# Patient Record
Sex: Male | Born: 1970 | Race: White | Hispanic: No | Smoking: Never smoker
Health system: Southern US, Community
[De-identification: ages and names within clinical notes are randomized; demographics above are authoritative.]

## PROBLEM LIST (undated history)

## (undated) DIAGNOSIS — E0590 Thyrotoxicosis, unspecified without thyrotoxic crisis or storm: Secondary | ICD-10-CM

## (undated) DIAGNOSIS — F329 Major depressive disorder, single episode, unspecified: Secondary | ICD-10-CM

## (undated) DIAGNOSIS — F32A Depression, unspecified: Secondary | ICD-10-CM

## (undated) DIAGNOSIS — F419 Anxiety disorder, unspecified: Secondary | ICD-10-CM

## (undated) HISTORY — DX: Major depressive disorder, single episode, unspecified: F32.9

## (undated) HISTORY — PX: WISDOM TOOTH EXTRACTION: SHX21

## (undated) HISTORY — PX: NO PAST SURGERIES: SHX2092

## (undated) HISTORY — DX: Depression, unspecified: F32.A

## (undated) HISTORY — DX: Anxiety disorder, unspecified: F41.9

---

## 2001-09-08 ENCOUNTER — Encounter: Payer: Self-pay | Admitting: *Deleted

## 2001-09-08 ENCOUNTER — Encounter: Admission: RE | Admit: 2001-09-08 | Discharge: 2001-09-08 | Payer: Self-pay | Admitting: *Deleted

## 2007-10-13 ENCOUNTER — Encounter: Admission: RE | Admit: 2007-10-13 | Discharge: 2007-10-13 | Payer: Self-pay | Admitting: Family Medicine

## 2008-07-07 IMAGING — CR DG FINGER RING 2+V*L*
1 series · 1 of 1 positions shown · non-contrast
Comparison: none

CLINICAL DATA: Pain and swelling distal fourth finger. 
 LEFT FOURTH FINGER - 3 VIEW:

[view not recorded]
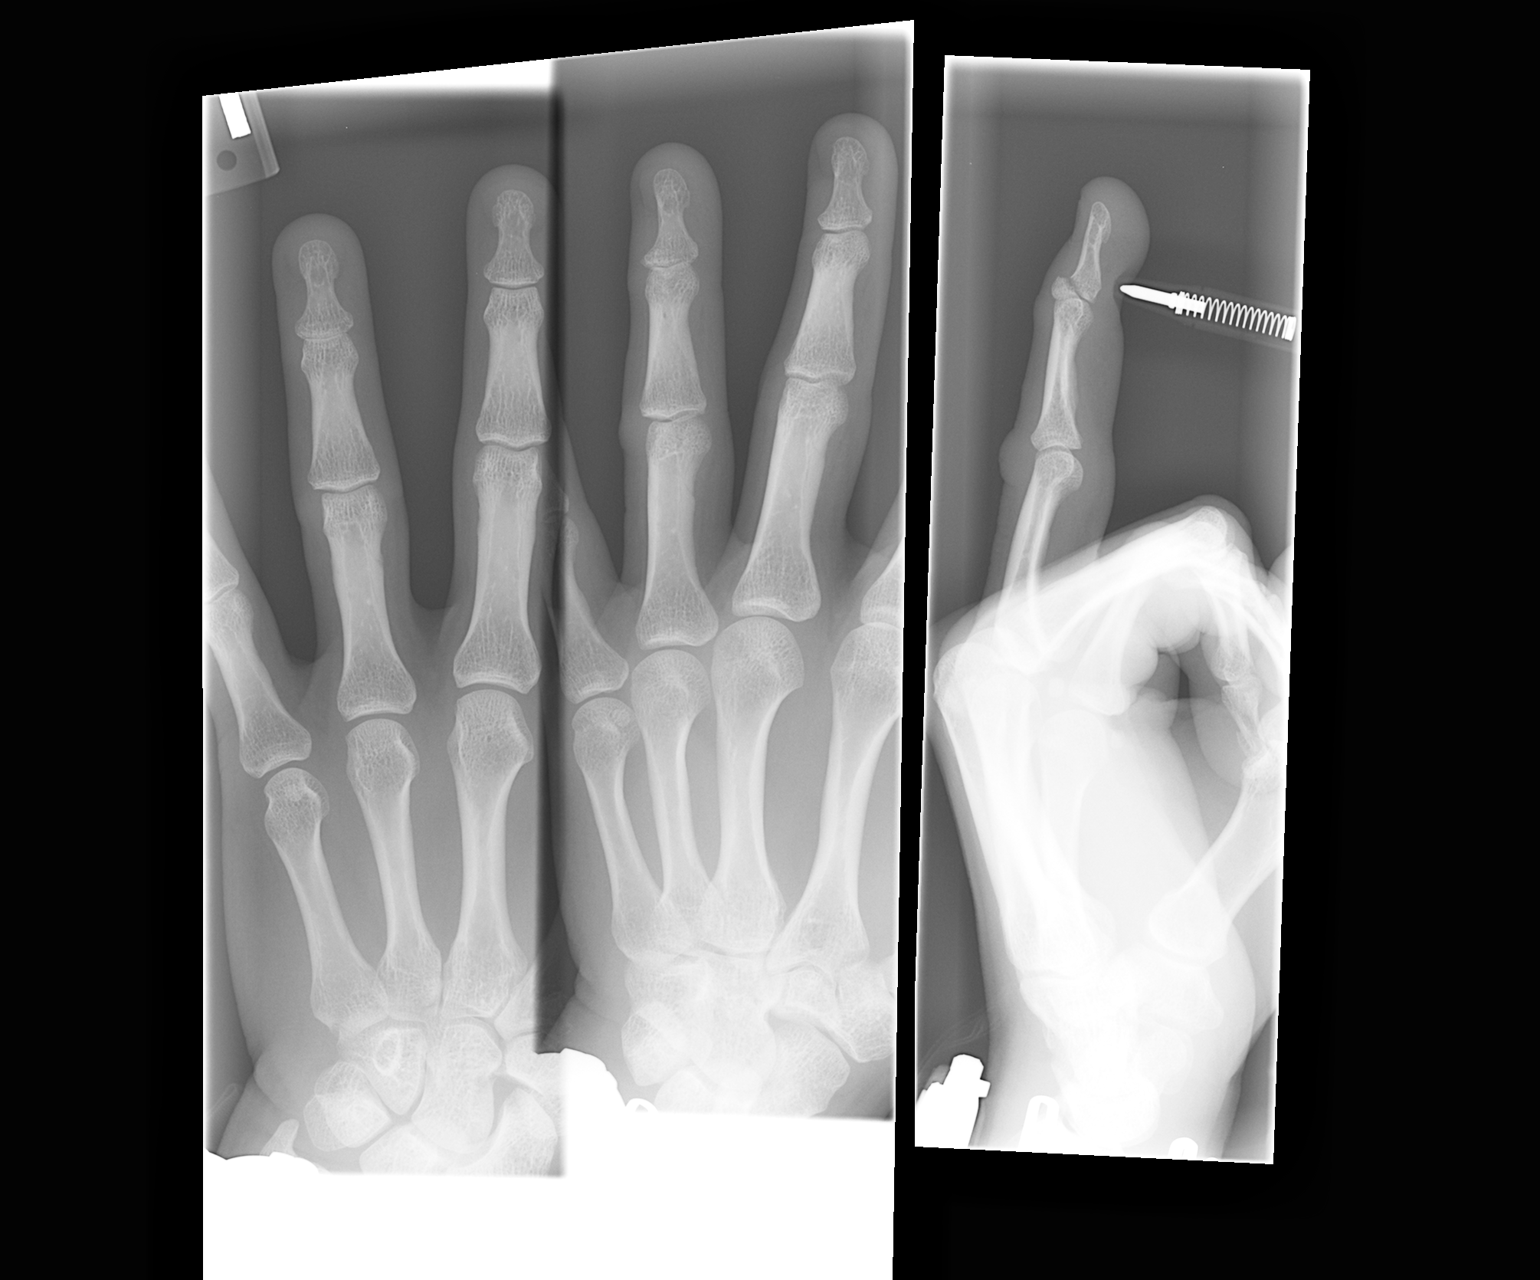

[1 of 1 positions shown; findings below may reference images not displayed]

FINDINGS: Three views of the left fourth finger were obtained.  There is an oblique slightly displaced fracture through the base of the distal phalanx which extends intra-articular into the fourth DIP joint.  No other abnormality is seen.
IMPRESSION: Oblique slightly displaced fracture of the base of the distal phalanx of the left fourth digit extending into the left fourth DIP joint.

## 2013-09-28 ENCOUNTER — Other Ambulatory Visit (HOSPITAL_COMMUNITY): Payer: 59 | Attending: Psychiatry | Admitting: Psychiatry

## 2013-09-28 ENCOUNTER — Encounter (HOSPITAL_COMMUNITY): Payer: Self-pay

## 2013-09-28 DIAGNOSIS — F429 Obsessive-compulsive disorder, unspecified: Secondary | ICD-10-CM

## 2013-09-28 DIAGNOSIS — F332 Major depressive disorder, recurrent severe without psychotic features: Secondary | ICD-10-CM | POA: Insufficient documentation

## 2013-09-28 DIAGNOSIS — F32A Depression, unspecified: Secondary | ICD-10-CM

## 2013-09-28 DIAGNOSIS — F39 Unspecified mood [affective] disorder: Secondary | ICD-10-CM

## 2013-09-28 DIAGNOSIS — F411 Generalized anxiety disorder: Secondary | ICD-10-CM | POA: Insufficient documentation

## 2013-09-28 DIAGNOSIS — F329 Major depressive disorder, single episode, unspecified: Secondary | ICD-10-CM

## 2013-09-28 NOTE — Progress Notes (Signed)
Patient ID: Marvin Hunt, male   DOB: 1971-07-30, 42 y.o.   MRN: [id number] D:  This is a 42 yr old married, Caucasian, male, who was referred per [facility] (Candace Ashley, [facility]), treatment for worsening depressive and anxiety symptoms with SI.  Denies intent, but admits to a plan (shoot self).  Pt states that his wife removed all guns from the home and gave them to her brother.  Discussed safety options with patient.  Pt is able to contract for safety.  Denies HI or A/V hallucinations.  No prior suicide attempts nor gestures.  No prior psychiatric hospitalizations.  States he saw a therapist a few times at age 54 due to stealing.  Reports feeling worse for ~ 1 1/2 weeks.  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  According to pt, he has been stealing office toner for seven years.  Pt states he has been selling it to an outside vendor (on the Internet).  He confessed to his supervisor ~ one week ago.  The HR Director encouraged pt to go to [facility] for help.  Pt is currently suspended.  "I don't know if it's with or without pay."  According to pt, he doesn't know what all he spent the money he earned on.  "At the time, I thought it was a win-win.  I would feel a high while getting away with it."  Pt states the stealing all started whenever his supervisor yrs ago requested that pt dispose of all the unused toner.  "While I was throwing it away, I had an idea that someone probably would buy it from me.  So the first time I got money from selling it, I just kept doing it."  2)  Marriage of 13 yrs.  States they are growing apart.  Pt admits to watching pornography online.  Denies chatting with anyone.  Most recently watched it two weeks ago. Childhood:  When pt was in the seventh grade, his father left the family to be with a male he was having an affair with.  Parents divorced.  While patient's mother worked, pt and sibling were raised by their  grandparents.  Paternal Grandmother (deceased) was alcoholic.  States he didn't like school.  Denies any abuse. Sibling:  One younger sister Kids:  75 yo son and 48 yo son Pt denies any drugs/ETOH.  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  Pt states he most recently shoplifted two weeks ago.  He stole video game items for his sons. Pt doesn't know if there are legal charges pending against him or not. Pt completed all forms.  Scored 25 on the burns.  Pt will attend MH-IOP for ten days.  A:  Oriented pt.  Provided pt with an orientation folder.  Informed Wenda Overland, LPC ([facility]) of admit.  Will refer pt to a psychiatrist.  Encouraged support groups.  R:  Pt receptive.

## 2013-09-29 ENCOUNTER — Encounter (HOSPITAL_COMMUNITY): Payer: 59 | Attending: Psychiatry | Admitting: Psychiatry

## 2013-09-29 DIAGNOSIS — F329 Major depressive disorder, single episode, unspecified: Secondary | ICD-10-CM

## 2013-09-29 DIAGNOSIS — F411 Generalized anxiety disorder: Secondary | ICD-10-CM | POA: Insufficient documentation

## 2013-09-29 DIAGNOSIS — F429 Obsessive-compulsive disorder, unspecified: Secondary | ICD-10-CM | POA: Insufficient documentation

## 2013-09-29 DIAGNOSIS — F32A Depression, unspecified: Secondary | ICD-10-CM

## 2013-09-29 DIAGNOSIS — F332 Major depressive disorder, recurrent severe without psychotic features: Secondary | ICD-10-CM | POA: Insufficient documentation

## 2013-09-29 MED ORDER — MIRTAZAPINE 15 MG PO TABS: 15.0000 mg | ORAL_TABLET | Freq: Every day | ORAL | Status: DC

## 2013-09-29 NOTE — Progress Notes (Signed)
Psychiatric Assessment Adult  Patient Identification:  Marvin Hunt Date of Evaluation:  09/29/2013 Chief Complaint:  Depression and anxiety History of Chief Complaint:   This is a 42 yr old married, Caucasian, male, who was referred per [facility] (Candace Milstead, [facility]), treatment for worsening depressive and anxiety symptoms with SI. Denies intent, but admits to a plan (shoot self). Pt states that his wife removed all guns from the home and gave them to her brother. Discussed safety options with patient. Pt is able to contract for safety. Denies HI or A/V hallucinations. No prior suicide attempts nor gestures. No prior psychiatric hospitalizations. States he saw a therapist a few times at age 73 due to stealing. Reports feeling worse for ~ 1 1/2 weeks. 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 According to pt, he has been stealing office toner for seven years. Pt states he has been selling it to an outside vendor (on the Internet). He confessed to his supervisor ~ one week ago. The HR Director encouraged pt to go to [facility] for help. Pt is currently suspended. "I don't know if it's with or without pay." Patient is unsure if he'll be fired, there's also a strong possibility that the company may be pressing charges. Patient is very worried about this.  According to pt, he doesn't know what all he spent the money he earned on. "At the time, I thought it was a win-win. I would feel a high while getting away with it." Pt states the stealing all started whenever his supervisor yrs ago requested that pt dispose of all the unused toner. "While I was throwing it away, I had an idea that someone probably would buy it from me. So the first time I got money from selling it, I just kept doing it." 2) Marriage of 13 yrs. States they are growing apart. Pt admits to watching pornography online. Denies chatting with anyone. Most recently watched it two  weeks ago. Patient has a history of masturbating while watching porn. Patient states that he had great difficulty after his first child was born he experienced an adjustment disorder and problems with a torn are also began around that time. States that things really went downhill after the workup is second child. Childhood: When pt was in the seventh grade, his father left the family to be with a male he was having an affair with. Parents divorced. While patient's mother worked, pt and sibling were raised by their grandparents. Paternal Grandmother (deceased) was alcoholic. States he didn't like school. Denies any abuse.  Sibling: One younger sister  Kids: 53 yo son and 58 yo son  Pt denies any drugs/ETOH. 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 Pt states he most recently shoplifted two weeks ago. He stole video game items for his sons.  Pt doesn't know if there are legal charges pending against him or not.  Pt completed all forms. Scored 25 on the burns. Pt will attend MH-IOP for ten days. A: Oriented pt. Provided pt with an orientation folder. Informed Wenda Overland, LPC ([facility]) of admit. Will refer pt to a psychiatrist. Encouraged support groups. R: Pt receptive.         HPI Review of Systems Physical Exam  Depressive Symptoms: depressed mood, anhedonia, insomnia, psychomotor agitation, psychomotor retardation, fatigue, feelings of worthlessness/guilt, difficulty concentrating, hopelessness, recurrent thoughts of death, anxiety, loss of energy/fatigue, decreased appetite,  (Hypo) Manic Symptoms:  None   Anxiety Symptoms: Excessive Worry:  Yes Panic Symptoms:  No Agoraphobia:  No Obsessive Compulsive: Yes  Symptoms: Pornography Specific Phobias:  No Social Anxiety:  Yes  Psychotic Symptoms:  Hallucinations: No None Delusions:  No Paranoia:  No   Ideas of Reference:  No  PTSD Symptoms: Ever had a traumatic exposure:   No Had a traumatic exposure in the last month:  No Re-experiencing: No None Hypervigilance:  No Hyperarousal: No None Avoidance: No None  Traumatic Brain Injury: No   Past Psychiatric History: None Diagnosis:   Hospitalizations:   Outpatient Care:   Substance Abuse Care:   Self-Mutilation:   Suicidal Attempts:   Violent Behaviors:    Past Medical History:   Past Medical History  Diagnosis Date  . Anxiety   . Depression    History of Loss of Consciousness:  No Seizure History:  No Cardiac History:  No Allergies:  Not on File Current Medications:  Current Outpatient Prescriptions  Medication Sig Dispense Refill  . mirtazapine (REMERON) 15 MG tablet Take 1 tablet (15 mg total) by mouth at bedtime.  30 tablet  0   No current facility-administered medications for this visit.    Previous Psychotropic Medications: None Medication Dose                         Substance Abuse History in the last 12 months: None Substance Age of 1st Use Last Use Amount Specific Type  Nicotine      Alcohol      Cannabis      Opiates      Cocaine      Methamphetamines      LSD      Ecstasy      Benzodiazepines      Caffeine      Inhalants      Others:                          Medical Consequences of Substance Abuse:   Legal Consequences of Substance Abuse:   Family Consequences of Substance Abuse:   Blackouts:  No DT's:  No Withdrawal Symptoms:  No None  Social History: Current Place of Residence[place] of Birth:  Family Members:  Marital Status:  Married Children: 2  Sons:   Daughters:  Relationships:  Education:  Corporate treasurer Problems/Performance:  Religious Beliefs/Practices:  History of Abuse: none Teacher, music History:  None. Legal History: None Hobbies/Interests:   Family History:   Family History  Problem Relation Age of Onset  . Alcohol abuse Paternal Grandmother     Mental Status  Examination/Evaluation: Objective:  Appearance: Casual and Disheveled  Eye Contact::  Minimal  Speech:  Normal Rate and Slow  Volume:  Decreased  Mood:  Depressed and anxious   Affect:  Constricted, Depressed and Flat  Thought Process:  Goal Directed and Linear  Orientation:  Full (Time, Place, and Person)  Thought Content:  Obsessions and Rumination  Suicidal Thoughts:  No  Homicidal Thoughts:  No  Judgement:  Fair  Insight:  Fair  Psychomotor Activity:  Normal and Decreased  Akathisia:  No  Handed:  Right  AIMS (if indicated):  0  Assets:  Communication Skills Desire for Improvement Resilience Social Support Transportation    Laboratory/X-Ray Psychological Evaluation(s)       Assessment:  Axis I: Major Depression, Recurrent severe and Obsessive Compulsive Disorder  AXIS I Anxiety Disorder  NOS, Major Depression, Recurrent severe and Obsessive Compulsive Disorder  AXIS II Cluster B Traits  AXIS III Past Medical History  Diagnosis Date  . Anxiety   . Depression      AXIS IV occupational problems, other psychosocial or environmental problems, problems related to social environment and problems with primary support group  AXIS V 51-60 moderate symptoms   Treatment Plan/Recommendations:  Plan of Care: Start IOP, monitor mood safety and suicidal ideation.   Laboratory:  Obtain TSH and T4, CBC and metabolic  Psychotherapy: Group, individual therapy and medication management   Medications: I discussed the rationale risks benefits options of Remeron with the patient for his depression and anxiety and patient has given me his informed consent. He'll start Remeron 7.5 mg by mouth each bedtime for 2 days and then increase it to 15 mg by mouth each bedtime.   Routine PRN Medications:  Yes  Consultations: None   Safety Concerns:  None   Other:      Margit Banda, MD 10/15/201410:35 AM

## 2013-09-29 NOTE — Progress Notes (Signed)
    Daily Group Progress Note  Program: IOP  Group Time: 9:00-10:30 am   Participation Level: Active  Behavioral Response: Appropriate  Type of Therapy:  Process Group  Summary of Progress: Pt learned about how to use heartmath to reduce depression and anxiety symptoms.       Group Time: 10:30 am - 12:00 pm   Participation Level:  Active  Behavioral Response: Appropriate  Type of Therapy: Psycho-education Group  Summary of Progress: Pt learned about how to use heartmath to reduce depression and anxiety symptoms.    Carman Ching, LCSW

## 2013-09-30 ENCOUNTER — Telehealth (HOSPITAL_COMMUNITY): Payer: Self-pay | Admitting: Psychiatry

## 2013-09-30 ENCOUNTER — Encounter (HOSPITAL_COMMUNITY): Payer: 59

## 2013-09-30 NOTE — Progress Notes (Signed)
Patient ID: Marvin Hunt, male   DOB: [birth year], 42 y.o.   MRN: [id number] D:  Placed call to Dr. Rutherford Limerick to inform her about patient feeling very tired and groggy this morning.  A:  Dr. Rutherford Limerick would like pt to take the Remeron 30 min to one hour earlier and to drink a cup of strong coffee in the a.m.  Informed pt.  R:  Pt receptive.

## 2013-09-30 NOTE — Progress Notes (Signed)
    Daily Group Progress Note  Program: IOP  Group Time: 9:00-10:30 am   Participation Level: Active  Behavioral Response: Appropriate  Type of Therapy:  Process Group  Summary of Progress: Pt was tearful as he talked about "fear for his future" and described how for the past seven years he has been stealing supplies from his employer and selling them. Pt expressed concern for his wife and children and his future. Pt said he got a "thrill" out of stealing and selling and does not know why he did it. Pt said he feels "worthless" and "dissapointed" in himself.      Group Time: 10:30 am - 12:00 pm   Participation Level:  Active  Behavioral Response: Appropriate  Type of Therapy: Psycho-education Group  Summary of Progress: Pt learned the stress management skill of heartmath to reduce feelings of depression and anxiety.  Carman Ching, LCSW

## 2013-10-01 ENCOUNTER — Encounter (HOSPITAL_COMMUNITY): Payer: 59

## 2013-10-04 ENCOUNTER — Encounter (HOSPITAL_COMMUNITY): Payer: 59 | Admitting: Psychiatry

## 2013-10-04 DIAGNOSIS — F329 Major depressive disorder, single episode, unspecified: Secondary | ICD-10-CM

## 2013-10-04 DIAGNOSIS — F32A Depression, unspecified: Secondary | ICD-10-CM

## 2013-10-05 ENCOUNTER — Encounter (HOSPITAL_COMMUNITY): Payer: 59 | Admitting: Psychiatry

## 2013-10-05 DIAGNOSIS — F329 Major depressive disorder, single episode, unspecified: Secondary | ICD-10-CM

## 2013-10-05 DIAGNOSIS — F32A Depression, unspecified: Secondary | ICD-10-CM

## 2013-10-05 NOTE — Progress Notes (Signed)
    Daily Group Progress Note  Program: IOP  Group Time: 9:00-10:30 am   Participation Level: Active  Behavioral Response: Appropriate  Type of Therapy:  Process Group  Summary of Progress: Pt reports improved mood and a sense of hope and peace about his current place. 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      Group Time: 10:30 am - 12:00 pm   Participation Level:  Active  Behavioral Response: Appropriate  Type of Therapy: Psycho-education Group  Summary of Progress: Pt participated in a group on grief and loss and identified current losses impacting wellness and appropriate grieving strategies.   Carman Ching, LCSW

## 2013-10-06 ENCOUNTER — Encounter (HOSPITAL_COMMUNITY): Payer: 59 | Admitting: Psychiatry

## 2013-10-06 DIAGNOSIS — F32A Depression, unspecified: Secondary | ICD-10-CM

## 2013-10-06 DIAGNOSIS — F329 Major depressive disorder, single episode, unspecified: Secondary | ICD-10-CM

## 2013-10-07 ENCOUNTER — Encounter (HOSPITAL_COMMUNITY): Payer: 59 | Admitting: Psychiatry

## 2013-10-07 DIAGNOSIS — F32A Depression, unspecified: Secondary | ICD-10-CM

## 2013-10-07 DIAGNOSIS — F329 Major depressive disorder, single episode, unspecified: Secondary | ICD-10-CM

## 2013-10-07 NOTE — Progress Notes (Signed)
    Daily Group Progress Note  Program: IOP  Group Time: 9:00 am -12:00 pm  Participation Level: Active  Behavioral Response: Appropriate  Type of Therapy:  Process Group  Summary of Progress: member participated in two goodbye ceremonies for members ending the group and had the opportunity for healthy closure. Pt identified healthy forms of grieving losses.      Cathie Bonnell E, LCSW 

## 2013-10-07 NOTE — Progress Notes (Signed)
    Daily Group Progress Note  Program: IOP  Group Time: 9:00-10:30 am   Participation Level: Active  Behavioral Response: Appropriate  Type of Therapy:  Process Group  Summary of Progress: Pt is anxious as he awaits the decision from his employer on if they will pursue legal action against him. Pt said he may try to contact them today to get further information on their decision to try to reduce his stress level. Pt is making peace with his actions and working towards understanding his behavior and on healing his marriage and family from this incident.      Group Time: 10:30 am - 12:00 pm   Participation Level:  Active  Behavioral Response: Appropriate  Type of Therapy: Psycho-education Group  Summary of Progress: Pt learned about low self-esteem and how it begins and effects mood and behaviors today.   Carman Ching, LCSW

## 2013-10-07 NOTE — Progress Notes (Signed)
    Daily Group Progress Note  Program: IOP  Group Time: 9:00 am -12:00 pm  Participation Level: Active  Behavioral Response: Appropriate  Type of Therapy:  Process Group  Summary of Progress: Pt learned about the symptoms of depression and how the symptoms are present in their daily living and spoke about each symptom they are experiencing.        Arran Fessel E, LCSW 

## 2013-10-08 ENCOUNTER — Encounter (HOSPITAL_COMMUNITY): Payer: 59 | Admitting: Psychiatry

## 2013-10-08 DIAGNOSIS — F32A Depression, unspecified: Secondary | ICD-10-CM

## 2013-10-08 DIAGNOSIS — F329 Major depressive disorder, single episode, unspecified: Secondary | ICD-10-CM

## 2013-10-11 ENCOUNTER — Encounter (HOSPITAL_COMMUNITY): Payer: 59 | Admitting: Psychiatry

## 2013-10-11 DIAGNOSIS — F32A Depression, unspecified: Secondary | ICD-10-CM

## 2013-10-11 DIAGNOSIS — F329 Major depressive disorder, single episode, unspecified: Secondary | ICD-10-CM

## 2013-10-11 NOTE — Progress Notes (Signed)
    Daily Group Progress Note  Program: IOP  Group Time: 9:00-10:30 am   Participation Level: Active  Behavioral Response: Appropriate  Type of Therapy:  Process Group  Summary of Progress: Pt was quiet today and only spoke when called upon to share. Pt said he is feeling grateful for his family and trying to redirect his thoughts away from worries about what his employer will decide regarding pressing charges. Pt said he is trying to focus on enjoying time with his family as he awaits their decision Pt is receiving support from the group and reflecting on his past behaviors that led him to this current situation.      Group Time: 10:30 am - 12:00 pm   Participation Level:  Active  Behavioral Response: Appropriate  Type of Therapy: Psycho-education Group  Summary of Progress: Pt participated in a group with a focus on grief and loss and identified losses impacting overall wellness and effective grieving strategies.   Carman Ching, LCSW

## 2013-10-11 NOTE — Progress Notes (Signed)
    Daily Group Progress Note  Program: IOP  Group Time: 9:00-10:30 am   Participation Level: Active  Behavioral Response: Appropriate  Type of Therapy:  Process Group  Summary of Progress: Pt reports stable mood and optimism over his current situation but processed feeling "stuck" in not having closure and resolution to the situation that causes some anxiety.      Group Time: 10:30 am - 12:00 pm   Participation Level:  None  Behavioral Response: none  Type of Therapy: Psycho-education Group  Summary of Progress: Group ended an hour early due to Clinical research associate having a family emergency.   Carman Ching, LCSW

## 2013-10-12 ENCOUNTER — Encounter (HOSPITAL_COMMUNITY): Payer: 59 | Admitting: Psychiatry

## 2013-10-12 DIAGNOSIS — F329 Major depressive disorder, single episode, unspecified: Secondary | ICD-10-CM

## 2013-10-12 DIAGNOSIS — F32A Depression, unspecified: Secondary | ICD-10-CM

## 2013-10-13 ENCOUNTER — Encounter (HOSPITAL_COMMUNITY): Payer: 59 | Admitting: Psychiatry

## 2013-10-13 DIAGNOSIS — F329 Major depressive disorder, single episode, unspecified: Secondary | ICD-10-CM

## 2013-10-13 DIAGNOSIS — F32A Depression, unspecified: Secondary | ICD-10-CM

## 2013-10-13 NOTE — Progress Notes (Signed)
    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 Pt had expressed concerns about finances due to him not brining in any income and he said he feels hopeful now for his family and his future if he can contribute financially.      Group Time: 10:30 am - 12:00 pm   Participation Level:  Active  Behavioral Response: Appropriate  Type of Therapy: Psycho-education Group  Summary of Progress: pt learned the skill of mindfulness and how to apply it to every day life to reduce stress and depression and be "in the moment".  Carman Ching, LCSW

## 2013-10-13 NOTE — Progress Notes (Signed)
    Daily Group Progress Note  Program: IOP  Group Time: 9:00-10:30 am   Participation Level: Active  Behavioral Response: Appropriate  Type of Therapy:  Process Group  Summary of Progress:  Pt participated in discussion what makes group effective and processed barriers to maximizing progress in a group setting. Pt spoke about ways they could "sabotage their wellness" by not committing fully to the process and not being fully honest with what they struggles are.       Group Time: 10:30 am - 12:00 pm   Participation Level:  Active  Behavioral Response: Appropriate  Type of Therapy: Psycho-education Group  Summary of Progress: Pt participated in discussion what makes group effective and processed barriers to maximizing progress in a group setting. Pt spoke about ways they could "sabotage their wellness" by not committing fully to the process and not being fully honest with what they struggles are.    Carman Ching, LCSW

## 2013-10-14 ENCOUNTER — Encounter (HOSPITAL_COMMUNITY): Payer: 59 | Admitting: Psychiatry

## 2013-10-14 DIAGNOSIS — F32A Depression, unspecified: Secondary | ICD-10-CM

## 2013-10-14 DIAGNOSIS — F329 Major depressive disorder, single episode, unspecified: Secondary | ICD-10-CM

## 2013-10-14 NOTE — Progress Notes (Signed)
    Daily Group Progress Note  Program: IOP  Group Time: 9:00-10:30 am   Participation Level: Active  Behavioral Response: Appropriate  Type of Therapy:  Process Group  Summary of Progress: Pt appeared optimistic and hopeful again today due to feeling like the interview he had yesterday went well. He is hoping to start his new job next week and is trying to rebuild his self-confidence and his relationship with his wife following the situation at his previous job.      Group Time: 10:30 am - 12:00 pm   Participation Level:  Active  Behavioral Response: Appropriate  Type of Therapy: Psycho-education Group  Summary of Progress:  Pt participated in a goodbye ceremony for two members ending the group today and practiced having healthy closure and grieving losses.   Carman Ching, LCSW

## 2013-10-15 ENCOUNTER — Encounter (HOSPITAL_COMMUNITY): Payer: 59 | Admitting: Psychiatry

## 2013-10-15 DIAGNOSIS — F329 Major depressive disorder, single episode, unspecified: Secondary | ICD-10-CM

## 2013-10-15 DIAGNOSIS — F32A Depression, unspecified: Secondary | ICD-10-CM

## 2013-10-15 MED ORDER — MIRTAZAPINE 15 MG PO TABS: 15.0000 mg | ORAL_TABLET | Freq: Every day | ORAL | Status: DC

## 2013-10-15 NOTE — Progress Notes (Signed)
Discharge Note  Patient:  Marvin Hunt is an 42 y.o., male DOB:  Jul 09, 1971  Date of Admission:  09/29/13  Date of Discharge:  10/15/13  Reason for Admission: Depression and anxiety  Hospital Course: Patient started IOP and was pretty severely depressed and anxious because of his job situation. He was started on Remeron 15 mg every night and this helped him significantly. He was fired from his job and patient was able to process this in group and started applying for new jobs and did get a couple of interviews.  Patient gradually stabilized with improved sleep and appetite and significant reduction of his anxiety, his mood improved he had no suicidal or homicidal ideation and was coping well and tolerating his medications well.  Mental Status at Discharge: Alert, oriented x3, affect was bright mood was euthymic speech was normal, no suicidal or homicidal ideation, no hallucinations or delusions. Recent and remote memory was good, judgment and insight was good, concentration and recall was good.  Lab Results: No results found for this or any previous visit (from the past 48 hour(s)).  Current outpatient prescriptions:mirtazapine (REMERON) 15 MG tablet, Take 1 tablet (15 mg total) by mouth at bedtime., Disp: 30 tablet, Rfl: 1  Axis Diagnosis:   Axis I: Anxiety Disorder NOS, Major Depression, Recurrent severe and Obsessive Compulsive Disorder Axis II: Cluster C Traits Axis III:  Past Medical History  Diagnosis Date  . Anxiety   . Depression    Axis IV: economic problems, occupational problems, other psychosocial or environmental problems, problems related to social environment and problems with primary support group Axis V: 61-70 mild symptoms   Level of Care:  OP  Discharge destination:  Home  Is patient on multiple antipsychotic therapies at discharge:  No    Has Patient had three or more failed trials of antipsychotic monotherapy by history:  No  Patient phone:   [phone] (home)  Patient address:   [address] [place] [address],   Follow-up recommendations:  Activity:  As tolerated Diet:  Regular Other:  Followup for medications with Dr. Lolly Mustache and Wenda Overland for therapy    The patient received suicide prevention pamphlet:  Yes  Margit Banda 10/15/2013, 12:01 PM

## 2013-10-15 NOTE — Patient Instructions (Signed)
Patient completed MH-IOP today.  Will follow up with Wenda Overland, LPC on 10-21-13 @ 10am and Dr. Lolly Mustache on 11-22-13 @ 11am.  Encouraged support groups.

## 2013-10-15 NOTE — Progress Notes (Signed)
    Daily Group Progress Note  Program: IOP  Group Time: 9:00-10:30 am   Participation Level: Active  Behavioral Response: Appropriate  Type of Therapy:  Process Group  Summary of Progress: Today was Pts final day in the group. Pt said he feels "optimistic" about his future and his new job opportunity. Pt said he was very depressed and hopeless at the start of the group but now feels hopeful. He said he made progress with forgiving himself for his mistakes and learning tools to distract away from his worrisome thought patterns.      Group Time: 10:30 am - 12:00 pm   Participation Level:  Active  Behavioral Response: Appropriate  Type of Therapy: Psycho-education Group  Summary of Progress: Pt participated in a goodbye ceremony and was introduced and practiced the skill of distraction to shift away from worrisome thinking and create positive feelings to replace the worry.   Carman Ching, LCSW

## 2013-10-15 NOTE — Progress Notes (Signed)
Patient ID: Marvin Hunt, male   DOB: [birth year], 42 y.o.   MRN: [id number] D:  This is a 42 yr old married, Caucasian, male, who was referred per [facility] (Candace Rincon, [facility]), treatment for worsening depressive and anxiety symptoms with SI. Denies SI/HI or A/V hallucinations. 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 According to pt, he has been stealing office toner for seven years. Pt states he has been selling it to an outside vendor (on the Internet). He confessed to his supervisor ~ one week ago. The HR Director encouraged pt to go to [facility] for help. Pt is currently suspended. "I don't know if it's with or without pay." According to pt, he doesn't know what all he spent the money he earned on. "At the time, I thought it was a win-win. I would feel a high while getting away with it." Pt states the stealing all started whenever his supervisor yrs ago requested that pt dispose of all the unused toner. "While I was throwing it away, I had an idea that someone probably would buy it from me. So the first time I got money from selling it, I just kept doing it." 2) Marriage of 13 yrs. States they are growing apart. Pt admits to watching pornography online. Denies chatting with anyone.  Pt completed MH-IOP today.  States he is feeling much better.  Decreased depressive and anxiety symptoms.  Had a job interview a couple of days ago.  Pt is waiting to hear back.  A:  D/C today.  F/U with Wenda Overland, LPC on 10-21-13 @ 10 am and Dr. Lolly Mustache 11-22-13 @ 11am.  Encouraged support groups.  R:  Pt receptive.

## 2013-10-18 ENCOUNTER — Encounter (HOSPITAL_COMMUNITY): Payer: 59

## 2013-10-19 ENCOUNTER — Encounter (HOSPITAL_COMMUNITY): Payer: 59

## 2013-11-22 ENCOUNTER — Encounter (HOSPITAL_COMMUNITY): Payer: Self-pay | Admitting: Psychiatry

## 2013-11-22 ENCOUNTER — Ambulatory Visit (INDEPENDENT_AMBULATORY_CARE_PROVIDER_SITE_OTHER): Payer: 59 | Admitting: Psychiatry

## 2013-11-22 VITALS — BP 126/85 | HR 75 | Ht 69.0 in | Wt 166.6 lb

## 2013-11-22 DIAGNOSIS — F329 Major depressive disorder, single episode, unspecified: Secondary | ICD-10-CM

## 2013-11-22 DIAGNOSIS — F429 Obsessive-compulsive disorder, unspecified: Secondary | ICD-10-CM

## 2013-11-22 MED ORDER — MIRTAZAPINE 30 MG PO TABS: 30.0000 mg | ORAL_TABLET | Freq: Every day | ORAL | Status: DC

## 2013-11-22 MED ORDER — HYDROXYZINE PAMOATE 25 MG PO CAPS: ORAL_CAPSULE | ORAL | Status: DC

## 2013-11-22 NOTE — Progress Notes (Addendum)
[facility] Initial Assessment Note  Marvin Hunt [id number] 42 y.o.  11/22/2013 11:48 AM  Chief Complaint:  I'm still very anxious and depressed.  History of Present Illness:  Patient is a 42 year old Caucasian, employed married man who was referred from intensive outpatient program.  Patient attended the intensive outpatient program from October 15 to October 31.  He was referred from [facility] because he was very depressed anxious and having planned to shoot himself.  His wife removed all the guns from the home.  Patient endorsed multiple stressors in his life.  Patient was working at [employer] for [employer] for more than 19 years and recently accused for stealing things. He also endorses having addiction with pornography.  He was very stressed about his job and he was unsure if he will be fired or prosecuted.  He admitted to stealing things and selling them.  He has spend all his money.  Recently he find out that company is going to press charges against him.  He does not know the court date but he knows he will will be a charge for embezzlement.  He was doing better with the Remeron which was started on intensive outpatient program however recently felt increased anxious and depressed.  Patient denies any suicidal thoughts.  He is involved in church.  He had a good supportive family.  He told he will never do anything to make his family embarrassed.  He is trying to get help.  His brother-in-law is accounting.  He has 2 sons .  He recently find a new job in [place].  He likes his job.  He is cooperative with Detective who promise not to disclose to his new job about his legal charges.  Patient admitted sometime feeling nervous anxious and worried about his future .  However he denies any paranoia, hallucination or any aggressive or violent behavior.  He is not drinking ETOH or using any illicit substances.  He is taking Remeron 15 mg without any side effects.  It is  helping his sleep .  He also endorses some time having obsessive thoughts but denies any rituals .  Denies any PTSD symptoms, homicidal thoughts or paranoia.  He endorse sometime excessive feelings of guilt, shame, low self-esteem decreased energy and lack of motivation to do things.  However his appetite is unchanged from the past.  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  Suicidal Ideation: No Plan Formed: No Patient has means to carry out plan: No  Homicidal Ideation: No Plan Formed: No Patient has means to carry out plan: No  Past Psychiatric History/Hospitalization(s) Patient denies any history of suicidal attempt, history of inpatient psychiatric treatment, paranoia, psychosis or any hallucination.  He recently finished intensive outpatient program. Anxiety: Yes Bipolar Disorder: No Depression: Yes Mania: No Psychosis: No Schizophrenia: No Personality Disorder: No Hospitalization for psychiatric illness: No History of Electroconvulsive Shock Therapy: No Prior Suicide Attempts: No  Medical History; Patient has no active medical problems.  He denies any history of head trauma, seizures, headaches, loss of consciousness.  He has no primary care physician.  Traumatic brain injury: Denies  Family History; Denies  Education and Work History; Patient is working as an Consulting civil engineer.  He started a new job in [employer].  He used to work at [employer] for [employer] for 19 years.  Psychosocial History; Patient born and raised in [place].  When he was in seventh grade his father left the  family to be with another male.  His parents are divorced.  He is married for more than 13 years.  He has 2 sons.  Patient has supportive wife.  Legal History; Patient is recently charged for embezzlement.  History Of Abuse; Denies  Substance Abuse History; Denies   Review of Systems: Psychiatric: Agitation: No Hallucination: No Depressed Mood:  Yes Insomnia: No Hypersomnia: No Altered Concentration: No Feels Worthless: No Grandiose Ideas: No Belief In Special Powers: No New/Increased Substance Abuse: No Compulsions: No  Neurologic: Headache: No Seizure: No Paresthesias: No    Outpatient Encounter Prescriptions as of 11/22/2013  Medication Sig  . mirtazapine (REMERON) 30 MG tablet Take 1 tablet (30 mg total) by mouth at bedtime.  . [DISCONTINUED] mirtazapine (REMERON) 15 MG tablet Take 1 tablet (15 mg total) by mouth at bedtime.  . [DISCONTINUED] mirtazapine (REMERON) 30 MG tablet Take 1 tablet (30 mg total) by mouth at bedtime.  . hydrOXYzine (VISTARIL) 25 MG capsule Take 1-2 as needed for severe anxiety    No results found for this or any previous visit (from the past 2160 hour(s)).    Physical Exam: Constitutional:  BP 126/85  Pulse 75  Ht 5\' 9"  (1.753 m)  Wt 166 lb 9.6 oz (75.569 kg)  BMI 24.59 kg/m2  Musculoskeletal: Strength & Muscle Tone: within normal limits Gait & Station: normal Patient leans: N/A  Mental Status Examination;  Patient is casually dressed and fairly groomed.  He appears to be in his his stated age.  His speech is clear and coherent.  His thought process is slow but logical.  He described his mood as depressed and anxious and his affect is constricted.  There were no flight of ideas or any loose association.  He had some rumination but no excessive thoughts or rituals.  His attention and concentration is fair.  He denies any active or any passive suicidal thoughts or homicidal thoughts.  Psychomotor activity is slightly decreased.  His fund of knowledge is adequate.  There were no tremors or shakes.  He is alert and oriented x3.  There were no acathisia.  His insight judgment and impulse control is okay.   Medical Decision Making (Choose Three): Established Problem, Stable/Improving (1), New problem, with additional work up planned, Review or order clinical lab tests (1), Decision to  obtain old records (1), Review and summation of old records (2), Established Problem, Worsening (2), Review of Last Therapy Session (1), Review of Medication Regimen & Side Effects (2) and Review of New Medication or Change in Dosage (2)  Assessment: Axis I: Maj. depressive disorder,  Axis II: Deferred  Axis III:  Past Medical History  Diagnosis Date  . Anxiety   . Depression     Axis IV: Mild to moderate   Plan:  I review his symptoms, current medications, history and psychosocial stressors.  Patient is experiencing increased anxiety and nervousness because recently he find out that his previous employer is going to press legal charges.  He is concerned about his future however he denies any suicidal thoughts or homicidal thoughts.  I recommend increase Remeron to 30 mg.  We will do blood work on his next appointment.  Recommend to continue counseling with [name] May .  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  Recommend to call us back if he has any question or any concern.  Followup in 2 weeks.  Time spent 55 minutes.  More than  50% of the time spent in psychoeducation, counseling and coordination of care.  Discuss safety plan that anytime having active suicidal thoughts or homicidal thoughts then patient need to call 911 or go to the local emergency room.    [name],[name]., MD 11/22/2013

## 2013-12-07 ENCOUNTER — Ambulatory Visit (HOSPITAL_COMMUNITY): Payer: Self-pay | Admitting: Psychiatry

## 2013-12-27 ENCOUNTER — Telehealth (HOSPITAL_COMMUNITY): Payer: Self-pay | Admitting: *Deleted

## 2013-12-27 DIAGNOSIS — F329 Major depressive disorder, single episode, unspecified: Secondary | ICD-10-CM

## 2013-12-27 MED ORDER — MIRTAZAPINE 30 MG PO TABS: 30.0000 mg | ORAL_TABLET | Freq: Every day | ORAL | Status: DC

## 2013-12-27 NOTE — Telephone Encounter (Signed)
Received faxed request for Remeron and pt also called to request refill. Patient wanted MD to know his depression is worse,especially in last week.  Left message on home phone #: Advised pt that only one appt on 12/8.Was to f/u in 2 weeks,but no appt since initial.Asked pt to call office and schedule appt to discuss how he feels/medication.Advised him once appt made,MD will be advised.Then refill of medicine can be authorized

## 2013-12-27 NOTE — Addendum Note (Signed)
Addended by: Tonny BollmanKNISLEY, Lamyiah Crawshaw M on: 12/27/2013 03:17 PM   Modules accepted: Orders

## 2013-12-30 ENCOUNTER — Encounter (HOSPITAL_COMMUNITY): Payer: Self-pay | Admitting: Psychiatry

## 2013-12-30 ENCOUNTER — Ambulatory Visit (INDEPENDENT_AMBULATORY_CARE_PROVIDER_SITE_OTHER): Payer: 59 | Admitting: Psychiatry

## 2013-12-30 VITALS — BP 120/70 | HR 82 | Ht 69.0 in | Wt 170.6 lb

## 2013-12-30 DIAGNOSIS — F329 Major depressive disorder, single episode, unspecified: Secondary | ICD-10-CM

## 2013-12-30 MED ORDER — MIRTAZAPINE 30 MG PO TABS: 30.0000 mg | ORAL_TABLET | Freq: Every day | ORAL | Status: DC

## 2013-12-30 MED ORDER — ESCITALOPRAM OXALATE 10 MG PO TABS: 10.0000 mg | ORAL_TABLET | Freq: Every day | ORAL | Status: DC

## 2013-12-30 NOTE — Progress Notes (Addendum)
[facility] [address] Progress Note   Marvin Hunt [id number] 43 y.o.  12/30/2013 4:24 PM  Chief Complaint:  I am feeling more depressed.  History of Present Illness:  Patient came for his followup appointment.  He is taking Remeron at bedtime.  He is feeling very anxious and continues to have regrets about his past.  He had a tough Christmas.  He is taking Vistaril more often because he feels very anxious nervous and sometimes having a lot of racing thoughts.  He admitted having passive and fleeting thoughts of suicide however he had mentioned that he will never do it because of his children.  He has 17 and 21 year old son.  The patient lives with his wife who is very supportive and 2 children. The patient is unable to find out trigger factors but mostly his past and excessive guilt and regrets.  He admitted sometime lack of energy and lack of motivation.  He likes his new job but is still misses his previous job where he has worked for 19 years.  Patient endorsed anhedonia with sometime feeling of hopelessness and helplessness.  He denies any hallucinations or any paranoia.  He is not drinking or using any illegal substances.  He also admitted noncompliance with his psychotherapy but promised to make an appointment with his therapist Fred May.  He is not involved in any addiction and child pornography .  I have recommended to see Madison Hickman however patient does not feel it is necessary because he is not involved in any recent activities for stealing, addiction and child pornography.  He is not drinking or using any illicit substances.  He is compliant with the Remeron.  He denies any tremors shakes or any other side effects.    Suicidal Ideation: Yes Plan Formed: No Patient has means to carry out plan: No  Homicidal Ideation: No Plan Formed: No Patient has means to carry out plan: No  Past Psychiatric History/Hospitalization(s) Patient denies any history of suicidal attempt,  history of inpatient psychiatric treatment, paranoia, psychosis or any hallucination.  He has done intensive outpatient program. Anxiety: Yes Bipolar Disorder: No Depression: Yes Mania: No Psychosis: No Schizophrenia: No Personality Disorder: No Hospitalization for psychiatric illness: No History of Electroconvulsive Shock Therapy: No Prior Suicide Attempts: No  Medical History; Patient has no active medical problems.  He denies any history of head trauma, seizures, headaches, loss of consciousness.  He has no primary care physician.  Review of Systems: Psychiatric: Agitation: No Hallucination: No Depressed Mood: Yes Insomnia: Yes Hypersomnia: No Altered Concentration: No Feels Worthless: Yes Grandiose Ideas: No Belief In Special Powers: No New/Increased Substance Abuse: No Compulsions: No  Neurologic: Headache: No Seizure: No Paresthesias: No    Outpatient Encounter Prescriptions as of 12/30/2013  Medication Sig  . hydrOXYzine (VISTARIL) 25 MG capsule Take 1-2 as needed for severe anxiety  . mirtazapine (REMERON) 30 MG tablet Take 1 tablet (30 mg total) by mouth at bedtime.  . [DISCONTINUED] mirtazapine (REMERON) 30 MG tablet Take 1 tablet (30 mg total) by mouth at bedtime.  Marland Kitchen escitalopram (LEXAPRO) 10 MG tablet Take 1 tablet (10 mg total) by mouth daily.    No results found for this or any previous visit (from the past 2160 hour(s)).    Physical Exam: Constitutional:  BP 120/70  Pulse 82  Ht 5\' 9"  (1.753 m)  Wt 170 lb 9.6 oz (77.384 kg)  BMI 25.18 kg/m2  Musculoskeletal: Strength & Muscle Tone: within normal limits Gait &  Station: normal Patient leans: N/A  Mental Status Examination;  Patient is casually dressed and fairly groomed.  He is anxious but cooperative.  His speech is clear and coherent.  His thought process is slow but logical.  He described his mood depressed and anxious and his affect is constricted.  There were no flight of ideas or any  loose association.  He had some rumination but no obsessive thoughts or rituals.  His attention and concentration is fair.  He endorsed passive and fleeting suicidal thoughts but no plan .  He denies any homicidal thoughts.  His psychomotor activity is decreased.  His fund of knowledge is adequate.  There were no tremors or shakes.  He is alert and oriented x3.  His insight judgment and impulse control is okay.   Medical Decision Making (Choose Three): Established Problem, Stable/Improving (1), Established Problem, Worsening (2), Review of Last Therapy Session (1), Review of Medication Regimen & Side Effects (2) and Review of New Medication or Change in Dosage (2)  Assessment: Axis I: Maj. depressive disorder,  Axis II: Deferred  Axis III:  Past Medical History  Diagnosis Date  . Anxiety   . Depression     Axis IV: Mild to moderate   Plan:  Patient is more depressed than usual.  I will add Lexapro 10 mg daily.  Continue on Remeron at present dose.  Recommend to use Vistaril for severe anxiety and nervousness.  Explained risks and benefits of medication.  Reinforced compliance with counseling. Discussed outpatient program but patient refused at this time.  He wants to give a try a new medication.  Recommend to call us back if he has any question or any concern.  Followup in 3-4 weeks.  I will do blood work on his next appointment.  Time spent 25 minutes.  More than 50% of the time spent in psychoeducation, counseling and coordination of care.  Discuss safety plan that anytime having active suicidal thoughts or homicidal thoughts then patient need to call 911 or go to the local emergency room.    [name],[name]., MD 12/30/2013

## 2014-01-27 ENCOUNTER — Ambulatory Visit (INDEPENDENT_AMBULATORY_CARE_PROVIDER_SITE_OTHER): Payer: 59 | Admitting: Psychiatry

## 2014-01-27 ENCOUNTER — Encounter (HOSPITAL_COMMUNITY): Payer: Self-pay | Admitting: Psychiatry

## 2014-01-27 DIAGNOSIS — F329 Major depressive disorder, single episode, unspecified: Secondary | ICD-10-CM

## 2014-01-27 MED ORDER — MIRTAZAPINE 30 MG PO TABS: 30.0000 mg | ORAL_TABLET | Freq: Every day | ORAL | Status: DC

## 2014-01-27 MED ORDER — ESCITALOPRAM OXALATE 10 MG PO TABS: 10.0000 mg | ORAL_TABLET | Freq: Every day | ORAL | Status: DC

## 2014-01-27 NOTE — Progress Notes (Addendum)
[facility] [address] Progress Note   Marvin Hunt [id number] 43 y.o.  01/27/2014 4:18 PM  Chief Complaint:  I like Lexapro.Marland Kitchen  History of Present Illness:  Marvin Hunt came for his followup appointment.  On his last visit we started him on Lexapro.  He stayed Lexapro 10 mg along with Remeron at bedtime.  He is feeling less anxious and less depressed.  He has taken only 2 times and Vistaril for severe anxiety.  His panic attack as less intense and less frequent.  He is sleeping better.  He continues to have some sadness and resentment about his past but overall he is feeling much better.  He denies any shakes or tremors.  His appetite is okay.  He is seeing [name] may for his counseling.  Patient continues to have support from his wife .  He feels things are getting better.  His energy level is also improved from the past.  He denies any crying spells, irritability, anger or any suicidal thoughts.  He was to continue Lexapro and Remeron.  Suicidal Ideation: Yes Plan Formed: No Patient has means to carry out plan: No  Homicidal Ideation: No Plan Formed: No Patient has means to carry out plan: No  Past Psychiatric History/Hospitalization(s) Patient denies any history of suicidal attempt, history of inpatient psychiatric treatment, paranoia, psychosis or any hallucination.  He had done intensive outpatient program. Anxiety: Yes Bipolar Disorder: No Depression: Yes Mania: No Psychosis: No Schizophrenia: No Personality Disorder: No Hospitalization for psychiatric illness: No History of Electroconvulsive Shock Therapy: No Prior Suicide Attempts: No  Medical History; Patient has no active medical problems.  He denies any history of head trauma, seizures, headaches, loss of consciousness.  He has no primary care physician.  Review of Systems: Psychiatric: Agitation: No Hallucination: No Depressed Mood: No Insomnia: No Hypersomnia: No Altered Concentration: No Feels  Worthless: Yes Grandiose Ideas: No Belief In Special Powers: No New/Increased Substance Abuse: No Compulsions: No  Neurologic: Headache: No Seizure: No Paresthesias: No    Outpatient Encounter Prescriptions as of 01/27/2014  Medication Sig  . escitalopram (LEXAPRO) 10 MG tablet Take 1 tablet (10 mg total) by mouth daily.  . hydrOXYzine (VISTARIL) 25 MG capsule Take 1-2 as needed for severe anxiety  . mirtazapine (REMERON) 30 MG tablet Take 1 tablet (30 mg total) by mouth at bedtime.  . [DISCONTINUED] escitalopram (LEXAPRO) 10 MG tablet Take 1 tablet (10 mg total) by mouth daily.  . [DISCONTINUED] mirtazapine (REMERON) 30 MG tablet Take 1 tablet (30 mg total) by mouth at bedtime.    No results found for this or any previous visit (from the past 2160 hour(s)).    Physical Exam: Constitutional:  There were no vitals taken for this visit.  Musculoskeletal: Strength & Muscle Tone: within normal limits Gait & Station: normal Patient leans: N/A  Mental Status Examination;  Patient is casually dressed and fairly groomed.  He is anxious but cooperative.  His speech is clear and coherent.  His thought process is logical and goal directed.  He described his mood euthymic and his affect is appropriate.  There were no flight of ideas or any loose association.  He denies any paranoia, delusions or any obsessive thoughts. His attention and concentration is fair.  He denies any active or passive suicidal thoughts or homicidal thoughts.  His psychomotor activity is okay.  His fund of knowledge is adequate.  There were no tremors or shakes.  He is alert and oriented x3.  His insight  judgment and impulse control is okay.   Medical Decision Making (Choose Three): Established Problem, Stable/Improving (1), Review of Last Therapy Session (1) and Review of Medication Regimen & Side Effects (2)  Assessment: Axis I: Maj. depressive disorder,  Axis II: Deferred  Axis III:  Past Medical History   Diagnosis Date  . Anxiety   . Depression     Axis IV: Mild to moderate   Plan:  Patient is doing better on his current psychotropic medication.  I will continue Lexapro 10 mg daily and Remeron 30 mg at bedtime.  Patient has refill remaining on Vistaril he is taking only as needed.  Recommend to call us back if he has any question or any concern.  Followup in 2 months.    [name],[name]., MD 01/27/2014

## 2014-03-26 ENCOUNTER — Other Ambulatory Visit (HOSPITAL_COMMUNITY): Payer: Self-pay | Admitting: Psychiatry

## 2014-03-27 ENCOUNTER — Other Ambulatory Visit (HOSPITAL_COMMUNITY): Payer: Self-pay | Admitting: Psychiatry

## 2014-03-29 ENCOUNTER — Encounter (HOSPITAL_COMMUNITY): Payer: Self-pay | Admitting: Psychiatry

## 2014-03-29 ENCOUNTER — Ambulatory Visit (INDEPENDENT_AMBULATORY_CARE_PROVIDER_SITE_OTHER): Payer: 59 | Admitting: Psychiatry

## 2014-03-29 VITALS — BP 118/80 | HR 75 | Ht 69.0 in | Wt 177.2 lb

## 2014-03-29 DIAGNOSIS — F329 Major depressive disorder, single episode, unspecified: Secondary | ICD-10-CM

## 2014-03-29 MED ORDER — MIRTAZAPINE 30 MG PO TABS: 30.0000 mg | ORAL_TABLET | Freq: Every day | ORAL | Status: DC

## 2014-03-29 MED ORDER — ESCITALOPRAM OXALATE 10 MG PO TABS: ORAL_TABLET | ORAL | Status: DC

## 2014-03-29 NOTE — Progress Notes (Addendum)
[facility] [id number] Progress Note   Marvin Hunt [id number] 43 y.o.  03/29/2014 4:54 PM  Chief Complaint:  Medication management and followup.  History of Present Illness:  Marvin Hunt came for his followup appointment.  He is compliant with his Lexapro and denies any side effects.  He is less anxious and less nervous.  He has not taken Vistaril in past few weeks.  He is taking Remeron for sleep.  He has no issue with his sleep.  He denies any panic attack, crying spells or any feeling of hopelessness or worthlessness.  He has no tremors or shakes.  He was to continue his current psychotropic medication.  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  Is not drinking or using any illegal substances.  Suicidal Ideation: Yes Plan Formed: No Patient has means to carry out plan: No  Homicidal Ideation: No Plan Formed: No Patient has means to carry out plan: No  Past Psychiatric History/Hospitalization(s) Patient denies any history of suicidal attempt, history of inpatient psychiatric treatment, paranoia, psychosis or any hallucination.  He had done intensive outpatient program. Anxiety: Yes Bipolar Disorder: No Depression: Yes Mania: No Psychosis: No Schizophrenia: No Personality Disorder: No Hospitalization for psychiatric illness: No History of Electroconvulsive Shock Therapy: No Prior Suicide Attempts: No  Medical History; Patient has no active medical problems.  He denies any history of head trauma, seizures, headaches, loss of consciousness.  He has no primary care physician.  Review of Systems: Psychiatric: Agitation: No Hallucination: No Depressed Mood: No Insomnia: No Hypersomnia: No Altered Concentration: No Feels Worthless: Yes Grandiose Ideas: No Belief In Special Powers: No New/Increased Substance Abuse: No Compulsions: No  Neurologic: Headache: No Seizure: No Paresthesias: No    Outpatient Encounter Prescriptions  as of 03/29/2014  Medication Sig  . escitalopram (LEXAPRO) 10 MG tablet TAKE 1 TABLET BY MOUTH EVERY DAY  . hydrOXYzine (VISTARIL) 25 MG capsule Take 1-2 as needed for severe anxiety  . mirtazapine (REMERON) 30 MG tablet Take 1 tablet (30 mg total) by mouth at bedtime.  . [DISCONTINUED] escitalopram (LEXAPRO) 10 MG tablet TAKE 1 TABLET BY MOUTH EVERY DAY  . [DISCONTINUED] mirtazapine (REMERON) 30 MG tablet Take 1 tablet (30 mg total) by mouth at bedtime.    No results found for this or any previous visit (from the past 2160 hour(s)).    Physical Exam: Constitutional:  BP 118/80  Pulse 75  Ht 5\' 9"  (1.753 m)  Wt 177 lb 3.2 oz (80.377 kg)  BMI 26.16 kg/m2  Musculoskeletal: Strength & Muscle Tone: within normal limits Gait & Station: normal Patient leans: N/A  Mental Status Examination;  Patient is casually dressed and fairly groomed.  He is pleasant and cooperative.  His speech is clear and coherent.  His thought process is logical and goal directed.  He described his mood euthymic and his affect is appropriate.  There were no flight of ideas or any loose association.  He denies any paranoia, delusions or any obsessive thoughts. His attention and concentration is fair.  He denies any active or passive suicidal thoughts or homicidal thoughts.  His psychomotor activity is okay.  His fund of knowledge is adequate.  There were no tremors or shakes.  He is alert and oriented x3.  His insight judgment and impulse control is okay.   Established Problem, Stable/Improving (1), Review of Last Therapy Session (1) and Review of Medication Regimen & Side Effects (2)  Assessment: Axis I: Maj. depressive  disorder,  Axis II: Deferred  Axis III:  Past Medical History  Diagnosis Date  . Anxiety   . Depression     Axis IV: Mild to moderate   Plan:  Patient is doing better on his current psychotropic medication.  I will continue Lexapro 10 mg daily and Remeron 30 mg at bedtime.  Patient has  not been Vistaril at this time.  Recommend to call us back if he has any question or any concern.  Followup in 3 months.    Marvin Kos T., MD 03/29/2014

## 2014-04-01 ENCOUNTER — Other Ambulatory Visit (HOSPITAL_COMMUNITY): Payer: Self-pay | Admitting: Psychiatry

## 2014-05-27 ENCOUNTER — Other Ambulatory Visit (HOSPITAL_COMMUNITY): Payer: Self-pay | Admitting: Psychiatry

## 2014-06-01 ENCOUNTER — Other Ambulatory Visit (HOSPITAL_COMMUNITY): Payer: Self-pay | Admitting: Psychiatry

## 2014-06-28 ENCOUNTER — Encounter (HOSPITAL_COMMUNITY): Payer: Self-pay | Admitting: Psychiatry

## 2014-06-28 ENCOUNTER — Ambulatory Visit (INDEPENDENT_AMBULATORY_CARE_PROVIDER_SITE_OTHER): Payer: 59 | Admitting: Psychiatry

## 2014-06-28 VITALS — BP 130/79 | HR 63 | Ht 69.0 in | Wt 175.8 lb

## 2014-06-28 DIAGNOSIS — F321 Major depressive disorder, single episode, moderate: Secondary | ICD-10-CM

## 2014-06-28 MED ORDER — ESCITALOPRAM OXALATE 20 MG PO TABS: 20.0000 mg | ORAL_TABLET | Freq: Every day | ORAL | Status: DC

## 2014-06-28 MED ORDER — MIRTAZAPINE 15 MG PO TABS: 15.0000 mg | ORAL_TABLET | Freq: Every day | ORAL | Status: DC

## 2014-06-28 NOTE — Progress Notes (Signed)
[facility] [id number] Progress Note   Marvin Hunt [id number] 43 y.o.  06/28/2014 4:32 PM  Chief Complaint:  Medication management and followup.  History of Present Illness:  Marvin Hunt came for his followup appointment.  He is complaining of increased anxiety and nervousness.  He cut down his Remeron because he was feeling very tired in the morning .  He is taking Remeron 15 mg .  He is sleeping better.  He had a court date on August 31.  He is more anxious because he may have to serve jail time which could be up to 2 years .  He is facing charges for embezzlement.  He is concerned about his future.  He admitted some time passive and fleeting suicidal thoughts but denies any plan or any intent.  He wants to get better.  He is taking his medication and denies any side effects.  He is not drinking or using any illegal substances.  His current job is going very well.  He feels sometimes hopeless and helpless but denies any paranoia or any hallucination.  His wife is very supportive.  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  His vitals are stable.    Suicidal Ideation: Yes Plan Formed: No Patient has means to carry out plan: No  Homicidal Ideation: No Plan Formed: No Patient has means to carry out plan: No  Past Psychiatric History/Hospitalization(s) Patient denies any history of suicidal attempt, history of inpatient psychiatric treatment, paranoia, psychosis or any hallucination.  He had done intensive outpatient program. Anxiety: Yes Bipolar Disorder: No Depression: Yes Mania: No Psychosis: No Schizophrenia: No Personality Disorder: No Hospitalization for psychiatric illness: No History of Electroconvulsive Shock Therapy: No Prior Suicide Attempts: No  Medical History; Patient has no active medical problems.  He denies any history of head trauma, seizures, headaches, loss of consciousness.  He has no primary care physician.  Review of  Systems: Psychiatric: Agitation: No Hallucination: No Depressed Mood: No Insomnia: No Hypersomnia: No Altered Concentration: No Feels Worthless: Yes Grandiose Ideas: No Belief In Special Powers: No New/Increased Substance Abuse: No Compulsions: No  Neurologic: Headache: No Seizure: No Paresthesias: No    Outpatient Encounter Prescriptions as of 06/28/2014  Medication Sig  . escitalopram (LEXAPRO) 20 MG tablet Take 1 tablet (20 mg total) by mouth daily. TAKE 1 TABLET BY MOUTH EVERY DAY  . hydrOXYzine (VISTARIL) 25 MG capsule Take 1-2 as needed for severe anxiety  . mirtazapine (REMERON) 15 MG tablet Take 1 tablet (15 mg total) by mouth at bedtime.  . [DISCONTINUED] escitalopram (LEXAPRO) 10 MG tablet TAKE 1 TABLET BY MOUTH EVERY DAY  . [DISCONTINUED] escitalopram (LEXAPRO) 10 MG tablet TAKE 1 TABLET BY MOUTH EVERY DAY  . [DISCONTINUED] mirtazapine (REMERON) 30 MG tablet Take 1 tablet (30 mg total) by mouth at bedtime.    No results found for this or any previous visit (from the past 2160 hour(s)).    Physical Exam: Constitutional:  BP 130/79  Pulse 63  Ht 5\' 9"  (1.753 m)  Wt 175 lb 12.8 oz (79.742 kg)  BMI 25.95 kg/m2  Musculoskeletal: Strength & Muscle Tone: within normal limits Gait & Station: normal Patient leans: N/A  Mental Status Examination;  Patient is casually dressed and fairly groomed.  He appears more anxious and nervous.  He described his mood is sad and his affect is constricted. His speech is clear and coherent.  His thought process is logical and goal directed.  There were no flight of ideas  or any loose association.  He denies any paranoia, delusions or any obsessive thoughts. His attention and concentration is fair.  He endorse passive suicidal thoughts but denies any active suicidal thoughts or any plan.  He denies any homicidal thoughts.  His psychomotor activity is okay.  His fund of knowledge is adequate.  There were no tremors or shakes.  He is  alert and oriented x3.  His insight judgment and impulse control is okay.   Established Problem, Stable/Improving (1), Review of Psycho-Social Stressors (1), Established Problem, Worsening (2), Review of Last Therapy Session (1), Review of Medication Regimen & Side Effects (2) and Review of New Medication or Change in Dosage (2)  Assessment: Axis I: Maj. depressive disorder,  Axis II: Deferred  Axis III:  Past Medical History  Diagnosis Date  . Anxiety   . Depression     Axis IV: Mild to moderate   Plan:  Patient is more nervous and anxious.  He wants to cut down his Remeron because 30 mg is making him very groggy in the morning.  I recommended to try Lexapro 20 mg to help his anxiety and nervousness.  Recommend to continue Remeron 15 mg it is helping his sleep.  Patient is seeing Inocente SallesFredrick May for counseling.  Recommended to keep his appointment coping and social skills.  Discussed risks and benefits of medication.  His next court date is on August 31.  I will see him again in 4-5 weeks.  Time spent 25 minutes.  More than 50% of the time spent in psychoeducation, counseling and coordination of care.  Discuss safety plan that anytime having active suicidal thoughts or homicidal thoughts then patient need to call 911 or go to the local emergency room.  Hanish Laraia T., MD 06/28/2014

## 2014-07-01 ENCOUNTER — Other Ambulatory Visit (HOSPITAL_COMMUNITY): Payer: Self-pay | Admitting: Psychiatry

## 2014-07-26 ENCOUNTER — Ambulatory Visit (HOSPITAL_COMMUNITY): Payer: Self-pay | Admitting: Psychiatry

## 2014-08-11 ENCOUNTER — Other Ambulatory Visit (HOSPITAL_COMMUNITY): Payer: Self-pay | Admitting: Psychiatry

## 2014-09-15 ENCOUNTER — Encounter (HOSPITAL_COMMUNITY): Payer: Self-pay | Admitting: Psychiatry

## 2014-09-15 ENCOUNTER — Ambulatory Visit (INDEPENDENT_AMBULATORY_CARE_PROVIDER_SITE_OTHER): Payer: 59 | Admitting: Psychiatry

## 2014-09-15 VITALS — BP 132/87 | HR 54 | Ht 68.0 in | Wt 176.0 lb

## 2014-09-15 DIAGNOSIS — F329 Major depressive disorder, single episode, unspecified: Secondary | ICD-10-CM

## 2014-09-15 DIAGNOSIS — F330 Major depressive disorder, recurrent, mild: Secondary | ICD-10-CM

## 2014-09-15 MED ORDER — MIRTAZAPINE 15 MG PO TABS: 15.0000 mg | ORAL_TABLET | Freq: Every day | ORAL | Status: DC

## 2014-09-15 MED ORDER — ESCITALOPRAM OXALATE 20 MG PO TABS: 20.0000 mg | ORAL_TABLET | Freq: Every day | ORAL | Status: DC

## 2014-09-15 NOTE — Progress Notes (Signed)
[facility] [id number] Progress Note   Marvin PitterChristopher J Hunt [id number] 43 y.o.  09/15/2014 4:38 PM  Chief Complaint:  Medication management and followup.  History of Present Illness:  Marvin DeerChristopher came for his followup appointment.  He is taking Remeron 15 mg and Lexapro 20 mg .  On his last visit we decreased Remeron because 30 mg was making him sleepy.  We also increased Lexapro 20 mg to help his anxiety.  Overall he is doing better.  He went to court last week however date was extended to next month.  He usually takes Vistaril that he has a court date.  Patient has less negative thinking and he is more hopeful.  He's sleeping better.  He denies any sedation or any grogginess in the morning.  He is working and he likes his job.  His wife is very supportive.  He denies any recent crying spells, feelings of hopelessness or worthlessness.  Patient is facing embezzlement charges and he is concerned that he may sentence for 2 years jail .  Patient is noncritical using any illegal substances.  His appetite is okay.  His vitals are stable.  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  Suicidal Ideation: Yes Plan Formed: No Patient has means to carry out plan: No  Homicidal Ideation: No Plan Formed: No Patient has means to carry out plan: No  Past Psychiatric History/Hospitalization(s) Patient denies any history of suicidal attempt, history of inpatient psychiatric treatment, paranoia, psychosis or any hallucination.  He had done intensive outpatient program. Anxiety: Yes Bipolar Disorder: No Depression: Yes Mania: No Psychosis: No Schizophrenia: No Personality Disorder: No Hospitalization for psychiatric illness: No History of Electroconvulsive Shock Therapy: No Prior Suicide Attempts: No  Medical History; Patient has no active medical problems.  He denies any history of head trauma, seizures, headaches, loss of consciousness.  He has no primary care physician.  Review of  Systems: Psychiatric: Agitation: No Hallucination: No Depressed Mood: No Insomnia: No Hypersomnia: No Altered Concentration: No Feels Worthless: Yes Grandiose Ideas: No Belief In Special Powers: No New/Increased Substance Abuse: No Compulsions: No  Neurologic: Headache: No Seizure: No Paresthesias: No    Outpatient Encounter Prescriptions as of 09/15/2014  Medication Sig  . escitalopram (LEXAPRO) 20 MG tablet Take 1 tablet (20 mg total) by mouth daily.  . hydrOXYzine (VISTARIL) 25 MG capsule Take 1-2 as needed for severe anxiety  . mirtazapine (REMERON) 15 MG tablet Take 1 tablet (15 mg total) by mouth at bedtime.  . [DISCONTINUED] escitalopram (LEXAPRO) 20 MG tablet TAKE 1 TABLET BY MOUTH EVERY DAY  . [DISCONTINUED] mirtazapine (REMERON) 15 MG tablet TAKE 1 TABLET BY MOUTH AT BEDTIME    No results found for this or any previous visit (from the past 2160 hour(s)).    Physical Exam: Constitutional:  BP 132/87  Pulse 54  Ht 5\' 8"  (1.727 m)  Wt 176 lb (79.833 kg)  BMI 26.77 kg/m2  Musculoskeletal: Strength & Muscle Tone: within normal limits Gait & Station: normal Patient leans: N/A  Mental Status Examination;  Patient is casually dressed and fairly groomed.  He is shy but cooperative.  He described his mood fine and his affect is mood appropriate.  His speech is clear and coherent.  His thought process logical and goal-directed.  He denies any auditory or visual hallucination.  There were no delusions or any paranoia.  His psychomotor activity is slightly decreased.  His fund of knowledge is adequate.  He denies any active or passive  suicidal thoughts or homicidal thoughts.  There were no tremors or shakes.  He is alert and oriented x3.  His insight judgment and impulse control is okay.  Established Problem, Stable/Improving (1), Review of Psycho-Social Stressors (1), Review of Last Therapy Session (1) and Review of Medication Regimen & Side Effects  (2)  Assessment: Axis I: Maj. depressive disorder,  Axis II: Deferred  Axis III:  Past Medical History  Diagnosis Date  . Anxiety   . Depression     Axis IV: Mild to moderate   Plan:  Patient is a stable on his current medication.  I will continue Remeron 15 mg at bedtime and Lexapro 20 mg daily.  He does not have any concerns or side effects.  Recommended to call us back if he has any question or any concern.  He has court date next month .  He usually takes Vistaril then he has a court date.  He has enough refills remaining on Vistaril.  Followup in 3 months.  Myeisha Kruser T., MD 09/15/2014

## 2014-10-17 ENCOUNTER — Other Ambulatory Visit (HOSPITAL_COMMUNITY): Payer: Self-pay | Admitting: Psychiatry

## 2014-11-13 ENCOUNTER — Other Ambulatory Visit (HOSPITAL_COMMUNITY): Payer: Self-pay | Admitting: Psychiatry

## 2014-11-13 ENCOUNTER — Other Ambulatory Visit: Payer: Self-pay | Admitting: Psychiatry

## 2014-11-13 NOTE — Telephone Encounter (Signed)
Patient had appointment on 11/15/14.

## 2014-11-15 ENCOUNTER — Encounter (HOSPITAL_COMMUNITY): Payer: Self-pay | Admitting: Psychiatry

## 2014-11-15 ENCOUNTER — Ambulatory Visit (INDEPENDENT_AMBULATORY_CARE_PROVIDER_SITE_OTHER): Payer: 59 | Admitting: Psychiatry

## 2014-11-15 VITALS — BP 115/71 | HR 56 | Ht 69.0 in | Wt 168.4 lb

## 2014-11-15 DIAGNOSIS — F329 Major depressive disorder, single episode, unspecified: Secondary | ICD-10-CM

## 2014-11-15 DIAGNOSIS — F330 Major depressive disorder, recurrent, mild: Secondary | ICD-10-CM

## 2014-11-15 MED ORDER — MIRTAZAPINE 15 MG PO TABS: 15.0000 mg | ORAL_TABLET | Freq: Every day | ORAL | Status: DC

## 2014-11-15 MED ORDER — ESCITALOPRAM OXALATE 20 MG PO TABS: 20.0000 mg | ORAL_TABLET | Freq: Every day | ORAL | Status: DC

## 2014-11-15 NOTE — Progress Notes (Signed)
[id number]   Marvin PitterChristopher J Sereno [id number] 43 y.o.  11/15/2014 4:57 PM  Chief Complaint:  Medication management and followup.  History of Present Illness:  Marvin DeerChristopher came for his followup appointment.  He is compliant with his medication and denies any side effects.  He has not taken any Vistaril in past 3 months.  His next court date is in January.  He is not very worried about his court date.  He likes Remeron and Lexapro combination which is helping his anxiety and depression.  His thanksgivings was very busy.  His son has appendicitis and required emergency surgery and his grandmother died 2 days before Thanksgiving.  However he is handling all the situation very well.  He denies any irritability, anger, mood swing or any crying spells.  He denies any feeling of hopelessness or worthlessness.  His appetite is okay.  His vitals are stable.  He enjoyed working his job.  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  Patient is facing embezzlement charges and he may get up to 2 years of jail sentence.    Suicidal Ideation: Yes Plan Formed: No Patient has means to carry out plan: No  Homicidal Ideation: No Plan Formed: No Patient has means to carry out plan: No  Past Psychiatric History/Hospitalization(s) Patient denies any history of suicidal attempt, history of inpatient psychiatric treatment, paranoia, psychosis or any hallucination.  He had done intensive outpatient program. Anxiety: Yes Bipolar Disorder: No Depression: Yes Mania: No Psychosis: No Schizophrenia: No Personality Disorder: No Hospitalization for psychiatric illness: No History of Electroconvulsive Shock Therapy: No Prior Suicide Attempts: No  Medical History; Patient has no active medical problems.  He denies any history of head trauma, seizures, headaches, loss of consciousness.  He has no primary care physician.  Review of Systems: Psychiatric: Agitation: No Hallucination: No Depressed  Mood: No Insomnia: No Hypersomnia: No Altered Concentration: No Feels Worthless: Yes Grandiose Ideas: No Belief In Special Powers: No New/Increased Substance Abuse: No Compulsions: No  Neurologic: Headache: No Seizure: No Paresthesias: No    Outpatient Encounter Prescriptions as of 11/15/2014  Medication Sig  . escitalopram (LEXAPRO) 20 MG tablet Take 1 tablet (20 mg total) by mouth daily.  . hydrOXYzine (VISTARIL) 25 MG capsule Take 1-2 as needed for severe anxiety  . mirtazapine (REMERON) 15 MG tablet Take 1 tablet (15 mg total) by mouth at bedtime.  . [DISCONTINUED] escitalopram (LEXAPRO) 20 MG tablet Take 1 tablet (20 mg total) by mouth daily.  . [DISCONTINUED] mirtazapine (REMERON) 15 MG tablet TAKE 1 TABLET (15 MG TOTAL) BY MOUTH AT BEDTIME.    No results found for this or any previous visit (from the past 2160 hour(s)).    Physical Exam: Constitutional:  BP 115/71 mmHg  Pulse 56  Ht 5\' 9"  (1.753 m)  Wt 168 lb 6.4 oz (76.386 kg)  BMI 24.86 kg/m2  Musculoskeletal: Strength & Muscle Tone: within normal limits Gait & Station: normal Patient leans: N/A  Mental Status Examination;  Patient is casually dressed and fairly groomed.  He is shy but cooperative.  He described his mood nervous and his affect is appropriate.  His speech is clear and coherent.  His thought process logical and goal-directed.  He denies any auditory or visual hallucination.  There were no delusions or any paranoia.  His psychomotor activity is slightly decreased.  His fund of knowledge is adequate.  He denies any active or passive suicidal thoughts or homicidal thoughts.  There were no tremors  or shakes.  He is alert and oriented x3.  His insight judgment and impulse control is okay.  Established Problem, Stable/Improving (1), Review of Psycho-Social Stressors (1), Review of Last Therapy Session (1) and Review of Medication Regimen & Side Effects (2)  Assessment: Axis I: Maj. depressive  disorder,  Axis II: Deferred  Axis III:  Past Medical History  Diagnosis Date  . Anxiety   . Depression     Axis IV: Mild to moderate   Plan:  Patient is a stable on his current medication.  I will continue Remeron 15 mg at bedtime and Lexapro 20 mg daily.  He does not have any concerns or side effects.  He does not maintain Vistaril.  Recommended to call us back if he has any question or any concern.  He has court date is on January 25th.  Followup in 3 months.  Claude Waldman T., MD 11/15/2014

## 2015-01-17 ENCOUNTER — Other Ambulatory Visit (HOSPITAL_COMMUNITY): Payer: Self-pay | Admitting: Psychiatry

## 2015-01-18 NOTE — Telephone Encounter (Signed)
Refills denied as was requested too soon and requested dosages did not match those ordered at patient's last evaluation.  Patient to be seen 02/14/15.

## 2015-02-14 ENCOUNTER — Ambulatory Visit (INDEPENDENT_AMBULATORY_CARE_PROVIDER_SITE_OTHER): Payer: BLUE CROSS/BLUE SHIELD | Admitting: Psychiatry

## 2015-02-14 ENCOUNTER — Encounter (HOSPITAL_COMMUNITY): Payer: Self-pay | Admitting: Psychiatry

## 2015-02-14 VITALS — BP 127/80 | HR 55 | Ht 69.0 in | Wt 171.2 lb

## 2015-02-14 DIAGNOSIS — F339 Major depressive disorder, recurrent, unspecified: Secondary | ICD-10-CM | POA: Diagnosis not present

## 2015-02-14 DIAGNOSIS — F330 Major depressive disorder, recurrent, mild: Secondary | ICD-10-CM

## 2015-02-14 MED ORDER — MIRTAZAPINE 15 MG PO TABS: 15.0000 mg | ORAL_TABLET | Freq: Every day | ORAL | Status: DC

## 2015-02-14 MED ORDER — ESCITALOPRAM OXALATE 20 MG PO TABS: 20.0000 mg | ORAL_TABLET | Freq: Every day | ORAL | Status: DC

## 2015-02-14 NOTE — Progress Notes (Signed)
[facility] [id number] [facility] Note   Marvin PitterChristopher J Hunt [id number] 44 y.o.  02/14/2015 4:44 PM  Chief Complaint:  Medication management and followup.  History of Present Illness:  Marvin DeerChristopher came for his followup appointment.  He is taking his medication and denies any side effects.  His court date was extended to next month .  He is facing embezzlement charges and he is working with his DA to reduce charges and time.  Patient told he was told that he may get 90 days in the jail in 3 years .  He is hoping to do 1 week and in the month but he is not sure about it.  He is not very anxious because he is aware that outcome will be not as bad.  He has not told his two children about his crime but he wants to tell them in the future .  He has a good supportive wife.  He is happy that his job is going very well and there has been no new issues.  Patient is taking Remeron and Lexapro.  He sleeping good.  He denies any agitation, anger, irritability or any mood swings.  He denies any feeling of hopelessness or worthlessness.  His appetite is okay.  His vitals are stable.    Suicidal Ideation: No Plan Formed: No Patient has means to carry out plan: No  Homicidal Ideation: No Plan Formed: No Patient has means to carry out plan: No  Past Psychiatric History/Hospitalization(s) Patient denies any history of suicidal attempt, history of inpatient psychiatric treatment, paranoia, psychosis or any hallucination.  He had done intensive outpatient program. Anxiety: No Bipolar Disorder: No Depression: Yes Mania: No Psychosis: No Schizophrenia: No Personality Disorder: No Hospitalization for psychiatric illness: No History of Electroconvulsive Shock Therapy: No Prior Suicide Attempts: No  Medical History; Patient has no active medical problems.  He denies any history of head trauma, seizures, headaches, loss of consciousness.  He has no primary care physician.  Review of  Systems: Psychiatric: Agitation: No Hallucination: No Depressed Mood: No Insomnia: No Hypersomnia: No Altered Concentration: No Feels Worthless: Yes Grandiose Ideas: No Belief In Special Powers: No New/Increased Substance Abuse: No Compulsions: No  Neurologic: Headache: No Seizure: No Paresthesias: No    Outpatient Encounter Prescriptions as of 02/14/2015  Medication Sig  . escitalopram (LEXAPRO) 20 MG tablet Take 1 tablet (20 mg total) by mouth daily.  . mirtazapine (REMERON) 15 MG tablet Take 1 tablet (15 mg total) by mouth at bedtime.  . [DISCONTINUED] escitalopram (LEXAPRO) 20 MG tablet Take 1 tablet (20 mg total) by mouth daily.  . [DISCONTINUED] hydrOXYzine (VISTARIL) 25 MG capsule Take 1-2 as needed for severe anxiety  . [DISCONTINUED] mirtazapine (REMERON) 15 MG tablet Take 1 tablet (15 mg total) by mouth at bedtime.    No results found for this or any previous visit (from the past 2160 hour(s)).    Physical Exam: Constitutional:  BP 127/80 mmHg  Pulse 55  Ht 5\' 9"  (1.753 m)  Wt 171 lb 3.2 oz (77.656 kg)  BMI 25.27 kg/m2  Musculoskeletal: Strength & Muscle Tone: within normal limits Gait & Station: normal Patient leans: N/A  Mental Status Examination;  Patient is casually dressed and fairly groomed.  He is shy but cooperative.  He described his mood nervous and his affect is appropriate.  His speech is clear and coherent.  His thought process logical and goal-directed.  He denies any auditory or visual hallucination.  There were no delusions or  any paranoia.  His psychomotor activity is normal.  His fund of knowledge is adequate.  He denies any active or passive suicidal thoughts or homicidal thoughts.  There were no tremors or shakes.  He is alert and oriented x3.  His insight judgment and impulse control is okay.  Established Problem, Stable/Improving (1), Review of Psycho-Social Stressors (1), Review of Last Therapy Session (1) and Review of Medication  Regimen & Side Effects (2)  Assessment: Axis I: Maj. depressive disorder,  Axis II: Deferred  Axis III:  Past Medical History  Diagnosis Date  . Anxiety   . Depression    Plan:  Patient is a stable on his current medication.  Reassurance given.  He is hoping if his legal issues resolved then he can come off from psychotropic medication.  At this time I will continue Remeron 15 mg at bedtime and Lexapro 20 mg daily.  I will discontinue Vistaril since he has not taken in 3 months.  He does not have any concerns or side effects.  Recommended to call us back if he has any question or any concern.  Followup in 3 months.  Khan Chura T., MD 02/14/2015

## 2015-02-16 ENCOUNTER — Other Ambulatory Visit (HOSPITAL_COMMUNITY): Payer: Self-pay | Admitting: Psychiatry

## 2015-05-13 ENCOUNTER — Other Ambulatory Visit (HOSPITAL_COMMUNITY): Payer: Self-pay | Admitting: Psychiatry

## 2015-05-17 ENCOUNTER — Encounter (HOSPITAL_COMMUNITY): Payer: Self-pay | Admitting: Psychiatry

## 2015-05-17 ENCOUNTER — Ambulatory Visit (INDEPENDENT_AMBULATORY_CARE_PROVIDER_SITE_OTHER): Payer: BLUE CROSS/BLUE SHIELD | Admitting: Psychiatry

## 2015-05-17 VITALS — BP 122/75 | HR 60 | Ht 69.0 in | Wt 171.0 lb

## 2015-05-17 DIAGNOSIS — F329 Major depressive disorder, single episode, unspecified: Secondary | ICD-10-CM | POA: Diagnosis not present

## 2015-05-17 DIAGNOSIS — F330 Major depressive disorder, recurrent, mild: Secondary | ICD-10-CM

## 2015-05-17 DIAGNOSIS — Z79899 Other long term (current) drug therapy: Secondary | ICD-10-CM | POA: Diagnosis not present

## 2015-05-17 LAB — CBC WITH DIFFERENTIAL/PLATELET
Basophils Absolute: 0.1 10*3/uL (ref 0.0–0.1)
Basophils Relative: 1 % (ref 0–1)
Eosinophils Absolute: 0.2 10*3/uL (ref 0.0–0.7)
Eosinophils Relative: 3 % (ref 0–5)
HCT: 41.5 % (ref 39.0–52.0)
Hemoglobin: 14.2 g/dL (ref 13.0–17.0)
Lymphocytes Relative: 38 % (ref 12–46)
Lymphs Abs: 2 10*3/uL (ref 0.7–4.0)
MCH: 30.9 pg (ref 26.0–34.0)
MCHC: 34.2 g/dL (ref 30.0–36.0)
MCV: 90.4 fL (ref 78.0–100.0)
MPV: 10.2 fL (ref 8.6–12.4)
Monocytes Absolute: 0.5 10*3/uL (ref 0.1–1.0)
Monocytes Relative: 9 % (ref 3–12)
Neutro Abs: 2.6 10*3/uL (ref 1.7–7.7)
Neutrophils Relative %: 49 % (ref 43–77)
Platelets: 256 10*3/uL (ref 150–400)
RBC: 4.59 MIL/uL (ref 4.22–5.81)
RDW: 14.2 % (ref 11.5–15.5)
WBC: 5.3 10*3/uL (ref 4.0–10.5)

## 2015-05-17 LAB — COMPLETE METABOLIC PANEL WITH GFR
ALT: 34 U/L (ref 0–53)
AST: 28 U/L (ref 0–37)
Albumin: 4.3 g/dL (ref 3.5–5.2)
Alkaline Phosphatase: 80 U/L (ref 39–117)
BUN: 14 mg/dL (ref 6–23)
CO2: 27 mEq/L (ref 19–32)
Calcium: 9.1 mg/dL (ref 8.4–10.5)
Chloride: 106 mEq/L (ref 96–112)
Creat: 0.84 mg/dL (ref 0.50–1.35)
GFR, Est African American: >89 mL/min
GFR, Est Non African American: >89 mL/min
Glucose, Bld: 84 mg/dL (ref 70–99)
Potassium: 4.2 mEq/L (ref 3.5–5.3)
Sodium: 142 mEq/L (ref 135–145)
Total Bilirubin: 0.5 mg/dL (ref 0.2–1.2)
Total Protein: 6.9 g/dL (ref 6.0–8.3)

## 2015-05-17 LAB — TSH: TSH: 0.156 u[IU]/mL — AB (ref 0.350–4.500)

## 2015-05-17 MED ORDER — MIRTAZAPINE 15 MG PO TABS: 15.0000 mg | ORAL_TABLET | Freq: Every day | ORAL | Status: DC

## 2015-05-17 MED ORDER — ESCITALOPRAM OXALATE 20 MG PO TABS: 20.0000 mg | ORAL_TABLET | Freq: Every day | ORAL | Status: AC

## 2015-05-17 NOTE — Progress Notes (Signed)
[facility] [id number]   Marvin Hunt [id number] 44 y.o.  05/17/2015 4:36 PM  Chief Complaint:  Medication management and followup.  History of Present Illness:  Marvin Hunt came for his followup appointment.  He is very relieved and relaxed since the court date 2 months ago.  He was given 60 days jail time which he will do on the weekends .  He has to pay money and after that charges will be dropped.  He is very happy about it.  He sleeping good.  He denies any irritability, anxiety or any panic attacks.  His energy level is good.  Recently he has noticed his attention and concentration is not as good.  He saw his primary care physician Dr. Kateri PlummerMorrow and find out that his TSH is very high.  He is scheduled to see endocrinologist.  Overall his mood has been stable.  He denies any feeling of hopelessness or worthlessness.  His job is going very well.  He is taking Remeron and Lexapro.  He has no tremors or shakes.  Patient denies drinking or using any illegal substances.  His appetite is okay.  His vitals are stable.  Suicidal Ideation: No Plan Formed: No Patient has means to carry out plan: No  Homicidal Ideation: No Plan Formed: No Patient has means to carry out plan: No  Past Psychiatric History/Hospitalization(s) Patient denies any history of suicidal attempt, history of inpatient psychiatric treatment, paranoia, psychosis or any hallucination.  He had done intensive outpatient program. Anxiety: No Bipolar Disorder: No Depression: Yes Mania: No Psychosis: No Schizophrenia: No Personality Disorder: No Hospitalization for psychiatric illness: No History of Electroconvulsive Shock Therapy: No Prior Suicide Attempts: No  Medical History; He denies any history of head trauma, seizures, headaches, loss of consciousness.  He saw Dr. Virl SonMauro .    Review of Systems: Psychiatric: Agitation: No Hallucination: No Depressed Mood: No Insomnia: No Hypersomnia:  No Altered Concentration: No Feels Worthless: Yes Grandiose Ideas: No Belief In Special Powers: No New/Increased Substance Abuse: No Compulsions: No  Neurologic: Headache: No Seizure: No Paresthesias: No    Outpatient Encounter Prescriptions as of 05/17/2015  Medication Sig  . escitalopram (LEXAPRO) 20 MG tablet Take 1 tablet (20 mg total) by mouth daily.  . mirtazapine (REMERON) 15 MG tablet Take 1 tablet (15 mg total) by mouth at bedtime.  . [DISCONTINUED] escitalopram (LEXAPRO) 20 MG tablet Take 1 tablet (20 mg total) by mouth daily.  . [DISCONTINUED] mirtazapine (REMERON) 15 MG tablet Take 1 tablet (15 mg total) by mouth at bedtime.   No facility-administered encounter medications on file as of 05/17/2015.    No results found for this or any previous visit (from the past 2160 hour(s)).    Physical Exam: Constitutional:  BP 122/75 mmHg  Pulse 60  Ht 5\' 9"  (1.753 m)  Wt 171 lb (77.565 kg)  BMI 25.24 kg/m2  Musculoskeletal: Strength & Muscle Tone: within normal limits Gait & Station: normal Patient leans: N/A  Mental Status Examination;  Patient is casually dressed and fairly groomed.  Marland Kitchen.  He is pleasant and cooperative.  He described his mood good and his affect is appropriate.  His speech is clear and coherent.  His thought process logical and goal-directed.  He denies any auditory or visual hallucination.  There were no delusions or any paranoia.  His psychomotor activity is normal.  His fund of knowledge is adequate.  He denies any active or passive suicidal thoughts or homicidal thoughts.  There  were no tremors or shakes.  He is alert and oriented x3.  His insight judgment and impulse control is okay.  Established Problem, Stable/Improving (1), Review of Psycho-Social Stressors (1), Review or order clinical lab tests (1), Review of Last Therapy Session (1) and Review of Medication Regimen & Side Effects (2)  Assessment: Axis I: Maj. depressive disorder,  Axis II:  Deferred  Axis III:  Past Medical History  Diagnosis Date  . Anxiety   . Depression    Plan:  Patient is a stable on his current medication.  .  Patient will see endocrinologist to rule out hypothyroidism.  I will continue Remeron and Lexapro at present dose.  We will order CBC, CMP, hemoglobin A1c and TSH.  Follow-up in 3 months.  Recommended to call us back if he has any question or any concern.  Sarena Jezek T., MD 05/17/2015

## 2015-05-18 LAB — HEMOGLOBIN A1C
Hgb A1c MFr Bld: 5.4 % (ref <5.7)
MEAN PLASMA GLUCOSE: 108 mg/dL (ref <117)

## 2015-08-17 ENCOUNTER — Ambulatory Visit (HOSPITAL_COMMUNITY): Payer: Self-pay | Admitting: Psychiatry

## 2015-08-18 ENCOUNTER — Other Ambulatory Visit (HOSPITAL_COMMUNITY): Payer: Self-pay | Admitting: Psychiatry

## 2015-08-18 DIAGNOSIS — F330 Major depressive disorder, recurrent, mild: Secondary | ICD-10-CM

## 2015-08-18 MED ORDER — MIRTAZAPINE 15 MG PO TABS: 15.0000 mg | ORAL_TABLET | Freq: Every day | ORAL | Status: AC

## 2015-09-16 ENCOUNTER — Other Ambulatory Visit (HOSPITAL_COMMUNITY): Payer: Self-pay | Admitting: Psychiatry

## 2015-09-18 ENCOUNTER — Encounter: Payer: Self-pay | Admitting: Psychiatry

## 2015-09-19 ENCOUNTER — Other Ambulatory Visit (HOSPITAL_COMMUNITY): Payer: Self-pay | Admitting: Psychiatry

## 2023-03-25 ENCOUNTER — Ambulatory Visit: Payer: Self-pay | Admitting: Surgery

## 2023-04-09 NOTE — Progress Notes (Addendum)
COVID Vaccine received:  []  No [x]  Yes Date of any COVID positive Test in last 90 days: None  PCP - Sharon Seller, NP at [facility] Phone: [phone]  Fax: [phone]   Cardiologist - none  Endocrinology-Matthew Shawnee Knapp, MD at [facility] Phone: [phone]  Fax: [phone]   Chest x-ray -  EKG -  No Hx to warrant Stress Test -  ECHO -  Cardiac Cath -   PCR screen: []  Ordered & Completed           []   No Order but Needs PROFEND           [x]   N/A for this surgery  Surgery Plan:  []  Ambulatory                            [x]  Outpatient in bed                            []  Admit  Anesthesia:    [x]  General  []  Spinal                           []   Choice []   MAC  Pacemaker / ICD device [x]  No []  Yes   Spinal Cord Stimulator:[x]  No []  Yes       History of Sleep Apnea? [x]  No []  Yes   CPAP used?- [x]  No []  Yes    Does the patient monitor blood sugar?          []  No []  Yes  [x]  N/A  Patient has: [x]  NO Hx DM   []  Pre-DM                 []  DM1  []   DM2  Blood Thinner / Instructions: None Aspirin Instructions: None  ERAS Protocol Ordered: []  No  [x]  Yes PRE-SURGERY []  ENSURE  []  G2  No drink ordered  Patient is to be NPO after: 08:30  Activity level: Patient is able to climb a flight of stairs without difficulty; [x]  No CP  [x]  No SOB. Patient can perform ADLs without assistance.   Anesthesia review: Anxiety, Tremors / palps d/t hyperthyroidism  Patient denies shortness of breath, fever, cough and chest pain at PAT appointment.  Patient verbalized understanding and agreement to the Pre-Surgical Instructions that were given to them at this PAT appointment. Patient was also educated of the need to review these PAT instructions again prior to his surgery.I reviewed the appropriate phone numbers to call if they have any and questions or concerns.

## 2023-04-09 NOTE — Patient Instructions (Addendum)
SURGICAL WAITING ROOM VISITATION Patients having surgery or a procedure may have no more than 2 support people in the waiting area - these visitors may rotate in the visitor waiting room.   Due to an increase in RSV and influenza rates and associated hospitalizations, children ages 13 and under may not visit patients in [facility] hospitals. If the patient needs to stay at the hospital during part of their recovery, the visitor guidelines for inpatient rooms apply.  PRE-OP VISITATION  Pre-op nurse will coordinate an appropriate time for 1 support person to accompany the patient in pre-op.  This support person may not rotate.  This visitor will be contacted when the time is appropriate for the visitor to come back in the pre-op area.  Please refer to the [facility] website for the visitor guidelines for Inpatients (after your surgery is over and you are in a regular room).  You are not required to quarantine at this time prior to your surgery. However, you must do this: Hand Hygiene often Do NOT share personal items Notify your provider if you are in close contact with someone who has COVID or you develop fever 100.4 or greater, new onset of sneezing, cough, sore throat, shortness of breath or body aches.  If you test positive for Covid or have been in contact with anyone that has tested positive in the last 10 days please notify you surgeon.    Your procedure is scheduled on:  Monday  Apr 21, 2023  Report to [facility] Main Entrance: [facility] entrance where the [employer] is available.   Report to admitting at: 09:15 AM  +++++Call this number if you have any questions or problems the morning of surgery [phone]  Do not eat food after Midnight the night prior to your surgery/procedure.  After Midnight you may have the following liquids until  08:30 AM  DAY OF SURGERY  Clear Liquid Diet Water Black Coffee (sugar ok, NO MILK/CREAM OR CREAMERS)  Tea (sugar ok, NO  MILK/CREAM OR CREAMERS) regular and decaf                             Plain Jell-O  with no fruit (NO RED)                                           Fruit ices (not with fruit pulp, NO RED)                                     Popsicles (NO RED)                                                                  Juice: apple, WHITE grape, WHITE cranberry Sports drinks like Gatorade or Powerade (NO RED)                FOLLOW  ANY ADDITIONAL PRE OP INSTRUCTIONS YOU RECEIVED FROM YOUR SURGEON'S OFFICE!!!   Oral Hygiene is also important to reduce your risk of infection.  Remember - BRUSH YOUR TEETH THE MORNING OF SURGERY WITH YOUR REGULAR TOOTHPASTE  Do NOT smoke after Midnight the night before surgery.  Take ONLY these medicines the morning of surgery with A SIP OF WATER:  escitalopram (Lexapro), methimazole (Tapazole)          You may not have any metal on your body including  jewelry, and body piercing  Do not wear lotions, powders, cologne, or deodorant  Men may shave face and neck.  Contacts, Hearing Aids, dentures or bridgework may not be worn into surgery. DENTURES WILL BE REMOVED PRIOR TO SURGERY PLEASE DO NOT APPLY "Poly grip" OR ADHESIVES!!!  You may bring a small overnight bag with you on the day of surgery, only pack items that are not valuable. [facility] IS NOT RESPONSIBLE   FOR VALUABLES THAT ARE LOST OR STOLEN.   Do not bring your home medications to the hospital. The Pharmacy will dispense medications listed on your medication list to you during your admission in the Hospital.  Please read over the following fact sheets you were given: IF YOU HAVE QUESTIONS ABOUT YOUR PRE-OP INSTRUCTIONS, PLEASE CALL [phone].   [place] - Preparing for Surgery Before surgery, you can play an important role.  Because skin is not sterile, your skin needs to be as free of germs as possible.  You can reduce the number of germs on your skin by washing with CHG (chlorahexidine  gluconate) soap before surgery.  CHG is an antiseptic cleaner which kills germs and bonds with the skin to continue killing germs even after washing. Please DO NOT use if you have an allergy to CHG or antibacterial soaps.  If your skin becomes reddened/irritated stop using the CHG and inform your nurse when you arrive at Short Stay. Do not shave (including legs and underarms) for at least 48 hours prior to the first CHG shower.  You may shave your face/neck.  Please follow these instructions carefully:  1.  Shower with CHG Soap the night before surgery and the  morning of surgery.  2.  If you choose to wash your hair, wash your hair first as usual with your normal  shampoo.  3.  After you shampoo, rinse your hair and body thoroughly to remove the shampoo.                             4.  Use CHG as you would any other liquid soap.  You can apply chg directly to the skin and wash.  Gently with a scrungie or clean washcloth.  5.  Apply the CHG Soap to your body ONLY FROM THE NECK DOWN.   Do not use on face/ open                           Wound or open sores. Avoid contact with eyes, ears mouth and genitals (private parts).                       Wash face,  Genitals (private parts) with your normal soap.             6.  Wash thoroughly, paying special attention to the area where your  surgery  will be performed.  7.  Thoroughly rinse your body with warm water from the neck down.  8.  DO NOT shower/wash with your normal soap after using and  rinsing off the CHG Soap.            9.  Pat yourself dry with a clean towel.            10.  Wear clean pajamas.            11.  Place clean sheets on your bed the night of your first shower and do not  sleep with pets.  ON THE DAY OF SURGERY : Do not apply any lotions/deodorants the morning of surgery.  Please wear clean clothes to the hospital/surgery center.    FAILURE TO FOLLOW THESE INSTRUCTIONS MAY RESULT IN THE CANCELLATION OF YOUR SURGERY  PATIENT  SIGNATURE_________________________________  NURSE SIGNATURE__________________________________  ________________________________________________________________________

## 2023-04-14 ENCOUNTER — Encounter (HOSPITAL_COMMUNITY)
Admission: RE | Admit: 2023-04-14 | Discharge: 2023-04-14 | Disposition: A | Discharge status: Home / Self Care | Payer: BC Managed Care – PPO | Source: Ambulatory Visit | Attending: Surgery | Admitting: Surgery

## 2023-04-14 ENCOUNTER — Encounter (HOSPITAL_COMMUNITY): Payer: Self-pay

## 2023-04-14 ENCOUNTER — Other Ambulatory Visit: Payer: Self-pay

## 2023-04-14 VITALS — BP 116/82 | HR 58 | Temp 98.4°F | Resp 12 | Ht 69.0 in | Wt 177.0 lb

## 2023-04-14 DIAGNOSIS — Z01812 Encounter for preprocedural laboratory examination: Secondary | ICD-10-CM | POA: Insufficient documentation

## 2023-04-14 DIAGNOSIS — Z01818 Encounter for other preprocedural examination: Secondary | ICD-10-CM

## 2023-04-14 HISTORY — DX: Thyrotoxicosis, unspecified without thyrotoxic crisis or storm: E05.90

## 2023-04-14 LAB — CBC
HCT: 44.3 % (ref 39.0–52.0)
Hemoglobin: 14.7 g/dL (ref 13.0–17.0)
MCH: 30.6 pg (ref 26.0–34.0)
MCHC: 33.2 g/dL (ref 30.0–36.0)
MCV: 92.1 fL (ref 80.0–100.0)
Platelets: 254 10*3/uL (ref 150–400)
RBC: 4.81 MIL/uL (ref 4.22–5.81)
RDW: 13 % (ref 11.5–15.5)
WBC: 4.6 10*3/uL (ref 4.0–10.5)
nRBC: 0 % (ref 0.0–0.2)

## 2023-04-14 LAB — BASIC METABOLIC PANEL
Anion gap: 7 (ref 5–15)
BUN: 17 mg/dL (ref 6–20)
CO2: 26 mmol/L (ref 22–32)
Calcium: 9.1 mg/dL (ref 8.9–10.3)
Chloride: 107 mmol/L (ref 98–111)
Creatinine, Ser: 1.01 mg/dL (ref 0.61–1.24)
GFR, Estimated: >60 mL/min (ref >60)
Glucose, Bld: 90 mg/dL (ref 70–99)
Potassium: 4.3 mmol/L (ref 3.5–5.1)
Sodium: 140 mmol/L (ref 135–145)

## 2023-04-17 ENCOUNTER — Encounter (HOSPITAL_COMMUNITY): Payer: Self-pay | Admitting: Surgery

## 2023-04-17 DIAGNOSIS — E0590 Thyrotoxicosis, unspecified without thyrotoxic crisis or storm: Secondary | ICD-10-CM | POA: Diagnosis present

## 2023-04-17 DIAGNOSIS — E041 Nontoxic single thyroid nodule: Secondary | ICD-10-CM | POA: Diagnosis present

## 2023-04-17 NOTE — H&P (Signed)
REFERRING PHYSICIAN: Betsey Amen, MD  PROVIDER: Tangela Dolliver Myra Rude, MD   Chief Complaint: New Consultation (Autonomous thyroid nodule, right)  History of Present Illness:  Patient is referred by Dr. 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 Patient had been noted in the fall 2023 to have a suppressed TSH level. Patient also noted development of a tremor. Patient was seen and evaluated by his endocrinologist. Laboratory studies showed a suppressed TSH level. He was started on methimazole. He is not on a beta-blocker. Patient underwent an ultrasound on January 22, 2023. This showed a right sided solid isoechoic thyroid nodule with punctate calcifications measuring 2.1 x 1.8 x 3.2 cm. The remainder of the thyroid gland appeared normal. Patient also underwent a thyroid uptake scan on February 04, 2023. This was consistent with an autonomous right thyroid nodule. Patient is now referred for consideration for surgical resection for definitive diagnosis and management. Patient has had no prior head or neck surgery. There is a family history of hypothyroidism in the patient's 15 year old son. 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  Review of Systems: A complete review of systems was obtained from the patient. I have reviewed this information and discussed as appropriate with the patient. See HPI as well for other ROS.  Review of Systems Constitutional: Negative. HENT: Negative. Eyes: Negative. Respiratory: Negative. Cardiovascular: Negative. Gastrointestinal: Negative. Genitourinary: Negative. Musculoskeletal: Negative. Skin: Negative. Neurological: Positive for tremors. Endo/Heme/Allergies: Negative. Psychiatric/Behavioral: Negative.   Medical History: Past Medical History: Diagnosis Date Thyroid disease  Patient Active Problem List Diagnosis Right thyroid nodule Hyperthyroidism  Past Surgical  History: Procedure Laterality Date wisdom tooth 2002   No Known Allergies  Current Outpatient Medications on File Prior to Visit Medication Sig Dispense Refill escitalopram oxalate (LEXAPRO) 20 MG tablet Take by mouth methIMAzole (TAPAZOLE) 5 MG tablet Take by mouth mirtazapine (REMERON) 30 MG tablet Take by mouth  No current facility-administered medications on file prior to visit.  Family History Problem Relation Age of Onset Breast cancer Mother Leukemia Mother High blood pressure (Hypertension) Father   Social History  Tobacco Use Smoking Status Never Smokeless Tobacco Never   Social History  Socioeconomic History Marital status: Married Tobacco Use Smoking status: Never Smokeless tobacco: Never Substance and Sexual Activity Alcohol use: Not Currently Drug use: Never  Objective:  Vitals: BP: 136/82 Pulse: 65 Temp: 36.9 C (98.5 F) SpO2: 99% Weight: 80.6 kg (177 lb 9.6 oz) Height: 175.3 cm (5\' 9" ) PainSc: 0-No pain  Body mass index is 26.23 kg/m.  Physical Exam  GENERAL APPEARANCE Comfortable, no acute issues Development: normal Gross deformities: none  SKIN Rash, lesions, ulcers: none Induration, erythema: none Nodules: none palpable  EYES Conjunctiva and lids: normal Pupils: equal and reactive  EARS, NOSE, MOUTH, THROAT External ears: no lesion or deformity External nose: no lesion or deformity Hearing: grossly normal  NECK Symmetric: no Trachea: midline Thyroid: In the mid to lower right thyroid lobe is a dominant firm mass measuring at least 3 cm in size, nontender, mobile with swallowing. Left thyroid lobe is normal to palpation. There is no associated lymphadenopathy.  CHEST Respiratory effort: normal Retraction or accessory muscle use: no Breath sounds: normal bilaterally Rales, rhonchi, wheeze: none  CARDIOVASCULAR Auscultation: regular rhythm, normal rate Murmurs: none Pulses: radial pulse 2+ palpable Lower  extremity edema: none  ABDOMEN Not assessed  GENITOURINARY/RECTAL Not assessed  MUSCULOSKELETAL Station and gait: normal Digits and nails: no clubbing or cyanosis Muscle  strength: grossly normal all extremities Range of motion: grossly normal all extremities Deformity: none Bilateral tremor in hands  LYMPHATIC Cervical: none palpable Supraclavicular: none palpable  PSYCHIATRIC Oriented to person, place, and time: yes Mood and affect: normal for situation Judgment and insight: appropriate for situation   Assessment and Plan:  Right thyroid nodule Hyperthyroidism  Patient is referred by his endocrinologist for surgical evaluation and management of an autonomous right thyroid nodule.  Patient provided with a copy of "The Thyroid Book: Medical and Surgical Treatment of Thyroid Problems", published by [employer], 16 pages. Book reviewed and explained to patient during visit today.  Today we reviewed his clinical history. We reviewed his laboratory studies. We reviewed his ultrasound and his thyroid uptake study. I have recommended proceeding with a right thyroid lobectomy for definitive diagnosis and management. Left thyroid lobe appears normal. We discussed the procedure. We discussed the size and location of the surgical incision. We discussed the risk and benefits of surgery including the risk of recurrent laryngeal nerve injury. We discussed the potential need for lifelong thyroid hormone replacement. We discussed the potential need for additional surgery for completion thyroidectomy in the event of certain types of malignancy. We discussed his postoperative recovery and returned to work and activities. The patient understands and wishes to proceed with surgery in the near future.  Darnell Level, MD [facility] practice Office: [phone]

## 2023-04-19 NOTE — Anesthesia Preprocedure Evaluation (Signed)
Anesthesia Evaluation  Patient identified by MRN, date of birth, ID band Patient awake    Reviewed: Allergy & Precautions, NPO status , Patient's Chart, lab work & pertinent test results  History of Anesthesia Complications Negative for: history of anesthetic complications  Airway Mallampati: II  TM Distance: >3 FB Neck ROM: Full    Dental no notable dental hx.    Pulmonary neg pulmonary ROS   Pulmonary exam normal        Cardiovascular negative cardio ROS Normal cardiovascular exam     Neuro/Psych   Anxiety Depression       GI/Hepatic negative GI ROS, Neg liver ROS,,,  Endo/Other   Hyperthyroidism Right thyroid nodule  Renal/GU negative Renal ROS     Musculoskeletal negative musculoskeletal ROS (+)    Abdominal   Peds  Hematology negative hematology ROS (+)   Anesthesia Other Findings Day of surgery medications reviewed with patient.  Reproductive/Obstetrics                              Anesthesia Physical Anesthesia Plan  ASA: 2  Anesthesia Plan: General   Post-op Pain Management: Tylenol PO (pre-op)*   Induction: Intravenous  PONV Risk Score and Plan: 2 and Ondansetron, Dexamethasone, Midazolam and Treatment may vary due to age or medical condition  Airway Management Planned: Oral ETT  Additional Equipment: None  Intra-op Plan:   Post-operative Plan: Extubation in OR  Informed Consent: I have reviewed the patients History and Physical, chart, labs and discussed the procedure including the risks, benefits and alternatives for the proposed anesthesia with the patient or authorized representative who has indicated his/her understanding and acceptance.     Dental advisory given  Plan Discussed with: CRNA  Anesthesia Plan Comments:         Anesthesia Quick Evaluation

## 2023-04-21 ENCOUNTER — Encounter (HOSPITAL_COMMUNITY)
Admission: RE | Disposition: A | Discharge status: Home / Self Care | Payer: Self-pay | Source: Home / Self Care | Attending: Surgery

## 2023-04-21 ENCOUNTER — Ambulatory Visit (HOSPITAL_COMMUNITY)
Admission: RE | Admit: 2023-04-21 | Discharge: 2023-04-22 | Disposition: A | Discharge status: Home / Self Care | Payer: BC Managed Care – PPO | Attending: Surgery | Admitting: Surgery

## 2023-04-21 ENCOUNTER — Ambulatory Visit (HOSPITAL_COMMUNITY): Payer: BC Managed Care – PPO | Admitting: Anesthesiology

## 2023-04-21 ENCOUNTER — Encounter (HOSPITAL_COMMUNITY): Payer: Self-pay | Admitting: Surgery

## 2023-04-21 ENCOUNTER — Other Ambulatory Visit: Payer: Self-pay

## 2023-04-21 DIAGNOSIS — D34 Benign neoplasm of thyroid gland: Secondary | ICD-10-CM | POA: Insufficient documentation

## 2023-04-21 DIAGNOSIS — E0510 Thyrotoxicosis with toxic single thyroid nodule without thyrotoxic crisis or storm: Secondary | ICD-10-CM | POA: Diagnosis present

## 2023-04-21 DIAGNOSIS — E041 Nontoxic single thyroid nodule: Secondary | ICD-10-CM

## 2023-04-21 DIAGNOSIS — E0590 Thyrotoxicosis, unspecified without thyrotoxic crisis or storm: Secondary | ICD-10-CM | POA: Diagnosis present

## 2023-04-21 HISTORY — PX: THYROID LOBECTOMY: SHX420

## 2023-04-21 SURGERY — LOBECTOMY, THYROID
Anesthesia: General | Laterality: Right

## 2023-04-21 MED ORDER — 0.9 % SODIUM CHLORIDE (POUR BTL) OPTIME
TOPICAL | Status: DC | PRN
  Administered 2023-04-21: 1000 mL

## 2023-04-21 MED ORDER — PROPOFOL 10 MG/ML IV BOLUS
INTRAVENOUS | Status: AC
  Filled 2023-04-21: qty 20

## 2023-04-21 MED ORDER — ONDANSETRON HCL 4 MG/2ML IJ SOLN
INTRAMUSCULAR | Status: DC | PRN
  Administered 2023-04-21: 4 mg via INTRAVENOUS

## 2023-04-21 MED ORDER — HYDROMORPHONE HCL 1 MG/ML IJ SOLN: 1.0000 mg | INTRAMUSCULAR | Status: DC | PRN

## 2023-04-21 MED ORDER — LACTATED RINGERS IV SOLN: INTRAVENOUS | Status: DC

## 2023-04-21 MED ORDER — MIRTAZAPINE 15 MG PO TABS
30.0000 mg | ORAL_TABLET | Freq: Every day | ORAL | Status: DC
  Administered 2023-04-21: 30 mg via ORAL
  Filled 2023-04-21: qty 2

## 2023-04-21 MED ORDER — ROCURONIUM BROMIDE 10 MG/ML (PF) SYRINGE
PREFILLED_SYRINGE | INTRAVENOUS | Status: AC
  Filled 2023-04-21: qty 10

## 2023-04-21 MED ORDER — DEXAMETHASONE SODIUM PHOSPHATE 10 MG/ML IJ SOLN
INTRAMUSCULAR | Status: DC | PRN
  Administered 2023-04-21: 8 mg via INTRAVENOUS

## 2023-04-21 MED ORDER — CEFAZOLIN SODIUM-DEXTROSE 2-4 GM/100ML-% IV SOLN
2.0000 g | INTRAVENOUS | Status: AC
  Administered 2023-04-21: 2 g via INTRAVENOUS
  Filled 2023-04-21: qty 100

## 2023-04-21 MED ORDER — SUGAMMADEX SODIUM 200 MG/2ML IV SOLN
INTRAVENOUS | Status: DC | PRN
  Administered 2023-04-21: 200 mg via INTRAVENOUS

## 2023-04-21 MED ORDER — ACETAMINOPHEN 500 MG PO TABS
1000.0000 mg | ORAL_TABLET | Freq: Once | ORAL | Status: AC
  Administered 2023-04-21: 1000 mg via ORAL
  Filled 2023-04-21: qty 2

## 2023-04-21 MED ORDER — SODIUM CHLORIDE 0.45 % IV SOLN: INTRAVENOUS | Status: DC

## 2023-04-21 MED ORDER — FENTANYL CITRATE PF 50 MCG/ML IJ SOSY: 25.0000 ug | PREFILLED_SYRINGE | INTRAMUSCULAR | Status: DC | PRN

## 2023-04-21 MED ORDER — ESCITALOPRAM OXALATE 20 MG PO TABS: 20.0000 mg | ORAL_TABLET | Freq: Every day | ORAL | Status: DC

## 2023-04-21 MED ORDER — ONDANSETRON HCL 4 MG/2ML IJ SOLN: 4.0000 mg | Freq: Four times a day (QID) | INTRAMUSCULAR | Status: DC | PRN

## 2023-04-21 MED ORDER — ORAL CARE MOUTH RINSE: 15.0000 mL | Freq: Once | OROMUCOSAL | Status: AC

## 2023-04-21 MED ORDER — DEXAMETHASONE SODIUM PHOSPHATE 10 MG/ML IJ SOLN
INTRAMUSCULAR | Status: AC
  Filled 2023-04-21: qty 1

## 2023-04-21 MED ORDER — OXYCODONE HCL 5 MG PO TABS: 5.0000 mg | ORAL_TABLET | Freq: Once | ORAL | Status: DC | PRN

## 2023-04-21 MED ORDER — CHLORHEXIDINE GLUCONATE 0.12 % MT SOLN
15.0000 mL | Freq: Once | OROMUCOSAL | Status: AC
  Administered 2023-04-21: 15 mL via OROMUCOSAL

## 2023-04-21 MED ORDER — OXYCODONE HCL 5 MG/5ML PO SOLN: 5.0000 mg | Freq: Once | ORAL | Status: DC | PRN

## 2023-04-21 MED ORDER — FENTANYL CITRATE (PF) 100 MCG/2ML IJ SOLN
INTRAMUSCULAR | Status: AC
  Filled 2023-04-21: qty 2

## 2023-04-21 MED ORDER — ACETAMINOPHEN 650 MG RE SUPP: 650.0000 mg | Freq: Four times a day (QID) | RECTAL | Status: DC | PRN

## 2023-04-21 MED ORDER — MIDAZOLAM HCL 2 MG/2ML IJ SOLN
INTRAMUSCULAR | Status: DC | PRN
  Administered 2023-04-21: 2 mg via INTRAVENOUS

## 2023-04-21 MED ORDER — HEMOSTATIC AGENTS (NO CHARGE) OPTIME
TOPICAL | Status: DC | PRN
  Administered 2023-04-21: 1

## 2023-04-21 MED ORDER — ROCURONIUM BROMIDE 10 MG/ML (PF) SYRINGE
PREFILLED_SYRINGE | INTRAVENOUS | Status: DC | PRN
  Administered 2023-04-21: 60 mg via INTRAVENOUS
  Administered 2023-04-21: 10 mg via INTRAVENOUS

## 2023-04-21 MED ORDER — MIDAZOLAM HCL 2 MG/2ML IJ SOLN
INTRAMUSCULAR | Status: AC
  Filled 2023-04-21: qty 2

## 2023-04-21 MED ORDER — OXYCODONE HCL 5 MG PO TABS: 5.0000 mg | ORAL_TABLET | ORAL | Status: DC | PRN

## 2023-04-21 MED ORDER — PROPOFOL 10 MG/ML IV BOLUS
INTRAVENOUS | Status: DC | PRN
  Administered 2023-04-21: 200 mg via INTRAVENOUS

## 2023-04-21 MED ORDER — ONDANSETRON 4 MG PO TBDP: 4.0000 mg | ORAL_TABLET | Freq: Four times a day (QID) | ORAL | Status: DC | PRN

## 2023-04-21 MED ORDER — FENTANYL CITRATE (PF) 100 MCG/2ML IJ SOLN
INTRAMUSCULAR | Status: DC | PRN
  Administered 2023-04-21: 100 ug via INTRAVENOUS
  Administered 2023-04-21: 50 ug via INTRAVENOUS

## 2023-04-21 MED ORDER — LIDOCAINE 2% (20 MG/ML) 5 ML SYRINGE
INTRAMUSCULAR | Status: DC | PRN
  Administered 2023-04-21: 100 mg via INTRAVENOUS

## 2023-04-21 MED ORDER — ACETAMINOPHEN 325 MG PO TABS: 650.0000 mg | ORAL_TABLET | Freq: Four times a day (QID) | ORAL | Status: DC | PRN

## 2023-04-21 MED ORDER — CHLORHEXIDINE GLUCONATE CLOTH 2 % EX PADS: 6.0000 | MEDICATED_PAD | Freq: Once | CUTANEOUS | Status: DC

## 2023-04-21 MED ORDER — AMISULPRIDE (ANTIEMETIC) 5 MG/2ML IV SOLN: 10.0000 mg | Freq: Once | INTRAVENOUS | Status: DC | PRN

## 2023-04-21 MED ORDER — ONDANSETRON HCL 4 MG/2ML IJ SOLN
INTRAMUSCULAR | Status: AC
  Filled 2023-04-21: qty 2

## 2023-04-21 MED ORDER — LIDOCAINE HCL (PF) 2 % IJ SOLN
INTRAMUSCULAR | Status: AC
  Filled 2023-04-21: qty 5

## 2023-04-21 MED ORDER — TRAMADOL HCL 50 MG PO TABS: 50.0000 mg | ORAL_TABLET | Freq: Four times a day (QID) | ORAL | Status: DC | PRN

## 2023-04-21 SURGICAL SUPPLY — 32 items
ADH SKN CLS APL DERMABOND .7 (GAUZE/BANDAGES/DRESSINGS) ×1
APL PRP STRL LF DISP 70% ISPRP (MISCELLANEOUS) ×1
ATTRACTOMAT 16X20 MAGNETIC DRP (DRAPES) ×1 IMPLANT
BAG COUNTER SPONGE SURGICOUNT (BAG) ×1 IMPLANT
BAG SPNG CNTER NS LX DISP (BAG) ×1
BLADE SURG 15 STRL LF DISP TIS (BLADE) ×1 IMPLANT
BLADE SURG 15 STRL SS (BLADE) ×1
CHLORAPREP W/TINT 26 (MISCELLANEOUS) ×1 IMPLANT
CLIP TI MEDIUM 6 (CLIP) ×2 IMPLANT
CLIP TI WIDE RED SMALL 6 (CLIP) ×2 IMPLANT
COVER SURGICAL LIGHT HANDLE (MISCELLANEOUS) ×1 IMPLANT
DERMABOND ADVANCED .7 DNX12 (GAUZE/BANDAGES/DRESSINGS) ×1 IMPLANT
DRAPE LAPAROTOMY T 98X78 PEDS (DRAPES) ×1 IMPLANT
DRAPE UTILITY XL STRL (DRAPES) ×1 IMPLANT
ELECT PENCIL ROCKER SW 15FT (MISCELLANEOUS) ×1 IMPLANT
ELECT REM PT RETURN 15FT ADLT (MISCELLANEOUS) ×1 IMPLANT
GAUZE 4X4 16PLY ~~LOC~~+RFID DBL (SPONGE) ×1 IMPLANT
GLOVE SURG ORTHO 8.0 STRL STRW (GLOVE) ×1 IMPLANT
GOWN STRL REUS W/ TWL XL LVL3 (GOWN DISPOSABLE) ×2 IMPLANT
GOWN STRL REUS W/TWL XL LVL3 (GOWN DISPOSABLE) ×2
HEMOSTAT SURGICEL 2X4 FIBR (HEMOSTASIS) ×1 IMPLANT
ILLUMINATOR WAVEGUIDE N/F (MISCELLANEOUS) ×1 IMPLANT
KIT BASIN OR (CUSTOM PROCEDURE TRAY) ×1 IMPLANT
KIT TURNOVER KIT A (KITS) IMPLANT
PACK BASIC VI WITH GOWN DISP (CUSTOM PROCEDURE TRAY) ×1 IMPLANT
SHEARS HARMONIC 9CM CVD (BLADE) ×1 IMPLANT
SUT MNCRL AB 4-0 PS2 18 (SUTURE) ×1 IMPLANT
SUT VIC AB 3-0 SH 18 (SUTURE) ×2 IMPLANT
SYR BULB IRRIG 60ML STRL (SYRINGE) ×1 IMPLANT
TOWEL OR 17X26 10 PK STRL BLUE (TOWEL DISPOSABLE) ×1 IMPLANT
TOWEL OR NON WOVEN STRL DISP B (DISPOSABLE) ×1 IMPLANT
TUBING CONNECTING 10 (TUBING) ×1 IMPLANT

## 2023-04-21 NOTE — Anesthesia Procedure Notes (Signed)
Procedure Name: Intubation Date/Time: 04/21/2023 11:04 AM  Performed by: Florene Route, CRNAPre-anesthesia Checklist: Patient identified, Emergency Drugs available, Suction available and Patient being monitored Patient Re-evaluated:Patient Re-evaluated prior to induction Oxygen Delivery Method: Circle system utilized Preoxygenation: Pre-oxygenation with 100% oxygen Induction Type: IV induction Ventilation: Mask ventilation without difficulty and Oral airway inserted - appropriate to patient size Laryngoscope Size: Miller and 3 Grade View: Grade II Tube type: Reinforced Number of attempts: 1 Airway Equipment and Method: Stylet and Oral airway Placement Confirmation: ETT inserted through vocal cords under direct vision, positive ETCO2 and breath sounds checked- equal and bilateral Secured at: 21 cm Tube secured with: Tape Dental Injury: Teeth and Oropharynx as per pre-operative assessment

## 2023-04-21 NOTE — Anesthesia Postprocedure Evaluation (Signed)
Anesthesia Post Note  Patient: Marvin Hunt  Procedure(s) Performed: RIGHT THYROID LOBECTOMY (Right)     Patient location during evaluation: PACU Anesthesia Type: General Level of consciousness: awake and alert Pain management: pain level controlled Vital Signs Assessment: post-procedure vital signs reviewed and stable Respiratory status: spontaneous breathing, nonlabored ventilation and respiratory function stable Cardiovascular status: blood pressure returned to baseline Postop Assessment: no apparent nausea or vomiting Anesthetic complications: no   No notable events documented.  Last Vitals:  Vitals:   04/21/23 1315 04/21/23 1335  BP: 118/81 122/78  Pulse: 65 62  Resp: 18 18  Temp:  36.9 C  SpO2: 100% 100%    Last Pain:  Vitals:   04/21/23 1335  TempSrc: Oral  PainSc: 2                  Shanda Howells

## 2023-04-21 NOTE — Transfer of Care (Signed)
Immediate Anesthesia Transfer of Care Note  Patient: Marvin Hunt  Procedure(s) Performed: RIGHT THYROID LOBECTOMY (Right)  Patient Location: PACU  Anesthesia Type:General  Level of Consciousness: awake and alert   Airway & Oxygen Therapy: Patient Spontanous Breathing and Patient connected to face mask oxygen  Post-op Assessment: Report given to RN and Post -op Vital signs reviewed and stable  Post vital signs: Reviewed and stable  Last Vitals:  Vitals Value Taken Time  BP 122/82 04/21/23 1235  Temp    Pulse 71 04/21/23 1237  Resp 15 04/21/23 1237  SpO2 100 % 04/21/23 1237  Vitals shown include unvalidated device data.  Last Pain:  Vitals:   04/21/23 0939  TempSrc:   PainSc: 0-No pain         Complications: No notable events documented.

## 2023-04-21 NOTE — Op Note (Signed)
Procedure Note  Pre-operative Diagnosis:  Toxic right thyroid nodule  Post-operative Diagnosis:  same  Surgeon:  Darnell Level, MD  Assistant:  Saunders Glance, PA-C   Procedure:  Right thyroid lobectomy and isthmusectomy  Anesthesia:  General  Estimated Blood Loss:  10 cc  Drains: none         Specimen: thyroid lobe to pathology  Indications:  Patient is referred by Dr. 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 Patient had been noted in the fall 2023 to have a suppressed TSH level. Patient also noted development of a tremor. Patient was seen and evaluated by his endocrinologist. Laboratory studies showed a suppressed TSH level. He was started on methimazole. He is not on a beta-blocker. Patient underwent an ultrasound on January 22, 2023. This showed a right sided solid isoechoic thyroid nodule with punctate calcifications measuring 2.1 x 1.8 x 3.2 cm. The remainder of the thyroid gland appeared normal. Patient also underwent a thyroid uptake scan on February 04, 2023. This was consistent with an autonomous right thyroid nodule. Patient is now referred for consideration for surgical resection for definitive diagnosis and management. Patient has had no prior head or neck surgery. There is a family history of hypothyroidism in the patient's 59 year old son. 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   Procedure Details: Procedure was done in OR #1 at the [facility]. The patient was brought to the operating room and placed in a supine position on the operating room table. Following administration of general anesthesia, the patient was positioned and then prepped and draped in the usual aseptic fashion. After ascertaining that an adequate level of anesthesia had been achieved, a small Kocher incision was made with #15 blade. Dissection was carried through subcutaneous tissues and platysma. Hemostasis was achieved with the  electrocautery. Skin flaps were elevated cephalad and caudad from the thyroid notch to the sternal notch. A self-retaining retractor was placed for exposure. Strap muscles were incised in the midline and dissection was begun on the right side. Strap muscles were reflected laterally. The right thyroid lobe was mildly enlarged with a dominant mass in the mid to inferior pole. The lobe was gently mobilized with blunt dissection. Superior pole vessels were dissected out and divided individually between small and medium ligaclips with the harmonic scalpel. The thyroid lobe was rolled anteriorly. Branches of the inferior thyroid artery were divided between small ligaclips with the harmonic scalpel. Inferior venous tributaries were divided between ligaclips. Both the superior and inferior parathyroid glands were identified and preserved on their vascular pedicles. The recurrent laryngeal nerve was identified and preserved along its course. The ligament of Allyson Sabal was released with the electrocautery and the gland was mobilized onto the anterior trachea. Isthmus was mobilized across the midline. There was no significant pyramidal lobe present. The thyroid parenchyma was transected at the junction of the isthmus and contralateral thyroid lobe with the harmonic scalpel. A suture was used to mark the isthmus margin. The thyroid lobe and isthmus were submitted to pathology for review.  The neck was irrigated with warm saline. Fibrillar was placed throughout the operative field. Strap muscles were approximated in the midline with interrupted 3-0 Vicryl sutures. Platysma was closed with interrupted 3-0 Vicryl sutures. Skin was closed with a running 4-0 Monocryl subcuticular suture.  Wound was washed and dried and Dermabond was applied. The patient was awakened from anesthesia and brought to the recovery room. The patient tolerated the procedure well.  Darnell Level, MD [facility] Office: [phone]

## 2023-04-21 NOTE — Interval H&P Note (Signed)
History and Physical Interval Note:  04/21/2023 10:24 AM  Marvin Hunt  has presented today for surgery, with the diagnosis of RIGHT THYROID NODULE, HYPERTHYROIDISM.  The various methods of treatment have been discussed with the patient and family. After consideration of risks, benefits and other options for treatment, the patient has consented to    Procedure(s) with comments: RIGHT THYROID LOBECTOMY (Right) - 2ND SCRUB PERSON as a surgical intervention.    The patient's history has been reviewed, patient examined, no change in status, stable for surgery.  I have reviewed the patient's chart and labs.  Questions were answered to the patient's satisfaction.    Darnell Level, MD [facility] practice Office: [phone]   Darnell Level

## 2023-04-22 ENCOUNTER — Encounter (HOSPITAL_COMMUNITY): Payer: Self-pay | Admitting: Surgery

## 2023-04-22 DIAGNOSIS — D34 Benign neoplasm of thyroid gland: Secondary | ICD-10-CM | POA: Diagnosis not present

## 2023-04-22 NOTE — Plan of Care (Signed)

## 2023-04-22 NOTE — Discharge Instructions (Signed)
[facility] - Dr. Rosa Gambale  THYROID & PARATHYROID SURGERY:  POST-OP INSTRUCTIONS  Always review the instruction sheet provided by the hospital nurse at discharge.  A prescription for pain medication may be sent to your pharmacy at the time of discharge.  Take your pain medication as prescribed.  If narcotic pain medicine is not needed, then you may take acetaminophen (Tylenol) or ibuprofen (Advil) as needed for pain or soreness.  Take your normal home medications as prescribed unless otherwise directed.  If you need a refill on your pain medication, please contact the office during regular business hours.  Prescriptions will not be processed by the office after 5:00PM or on weekends.  Start with a light diet upon arrival home, such as soup and crackers or toast.  Be sure to drink plenty of fluids.  Resume your normal diet the day after surgery.  Most patients will experience some swelling and bruising on the chest and neck area.  Ice packs will help for the first 48 hours after arriving home.  Swelling and bruising will take several days to resolve.   It is common to experience some constipation after surgery.  Increasing fluid intake and taking a stool softener (Colace) will usually help to prevent this problem.  A mild laxative (Milk of Magnesia or Miralax) should be taken according to package directions if there has been no bowel movement after 48 hours.  Dermabond glue covers your incision. This seals the wound and you may shower at any time. The Dermabond will remain in place for about a week.  You may gradually remove the glue when it loosens around the edges.  If you need to loosen the Dermabond for removal, apply a layer of Vaseline to the wound for 15 minutes and then remove with a Kleenex. Your sutures are under the skin and will not show - they will dissolve on their own.  You may resume light daily activities beginning the day after discharge (such as self-care,  walking, climbing stairs), gradually increasing activities as tolerated. You may have sexual intercourse when it is comfortable. Refrain from any heavy lifting or straining until approved by your doctor. You may drive when you no longer are taking prescription pain medication, you can comfortably wear a seatbelt, and you can safely maneuver your car and apply the brakes.  You will see your doctor in the office for a follow-up appointment approximately three weeks after your surgery.  Make sure that you call for this appointment within a day or two after you arrive home to insure a convenient appointment time. Please have any requested laboratory tests performed a few days prior to your office visit so that the results will be available at your follow up appointment.  WHEN TO CALL THE [facility] OFFICE: -- Fever greater than 101.5 -- Inability to urinate -- Nausea and/or vomiting - persistent -- Extreme swelling or bruising -- Continued bleeding from incision -- Increased pain, redness, or drainage from the incision -- Difficulty swallowing or breathing -- Muscle cramping or spasms -- Numbness or tingling in hands or around lips  The clinic staff is available to answer your questions during regular business hours.  Please don't hesitate to call and ask to speak to one of the nurses if you have concerns.  [facility] OFFICE: [phone] (24 hours)  Please sign up for MyChart accounts. This will allow you to communicate directly with my nurse or myself without having to call the office. It will also allow you   to view your test results. You will need to enroll in MyChart for my office ([facility]) and for the hospital ([facility]).  Makaylin Carlo, MD [facility] A [facility] practice 

## 2023-04-22 NOTE — Progress Notes (Signed)
Patient was given discharge instructions. All questions were answered. Transported via wheelchair to main entrance. 

## 2023-04-22 NOTE — TOC CM/SW Note (Signed)
Transition of Care [facility]) Screening Note  Patient Details  Name: Marvin Hunt Date of Birth: 29-Nov-1971  Transition of Care [facility]) CM/SW Contact:    Ewing Schlein, LCSW Phone Number: 04/22/2023, 8:53 AM  Transition of Care Department [facility]) has reviewed patient and no TOC needs have been identified at this time. We will continue to monitor patient advancement through interdisciplinary progression rounds. If new patient transition needs arise, please place a TOC consult.

## 2023-04-22 NOTE — Discharge Summary (Signed)
    Physician Discharge Summary   Patient ID: DAMIEL WORTHEY MRN: [id number] DOB/AGE: Apr 26, 1971 52 y.o.  Admit date: 04/21/2023  Discharge date: 04/22/2023  Discharge Diagnoses:  Principal Problem:   Right thyroid nodule Active Problems:   Hyperthyroidism   Toxic thyroid nodule   Discharged Condition: good  Hospital Course: Patient was admitted for observation following thyroid surgery.  Post op course was uncomplicated.  Pain was well controlled.  Tolerated diet. Patient was prepared for discharge home on POD#1.  Consults: None  Treatments: surgery: right thyroid lobectomy  Discharge Exam: Blood pressure 109/72, pulse (!) 51, temperature 98.5 F (36.9 C), temperature source Oral, resp. rate 18, height 5\' 9"  (1.753 m), weight 80.3 kg, SpO2 97 %. HEENT - clear Neck - wound dry and intact; mild STS; voice normal  Disposition: Home  Discharge Instructions     Diet - low sodium heart healthy   Complete by: As directed    Increase activity slowly   Complete by: As directed    No dressing needed   Complete by: As directed       Allergies as of 04/22/2023   No Known Allergies      Medication List     TAKE these medications    escitalopram 20 MG tablet Commonly known as: LEXAPRO Take 1 tablet (20 mg total) by mouth daily.   mirtazapine 30 MG tablet Commonly known as: REMERON Take 30 mg by mouth at bedtime.   mirtazapine 15 MG tablet Commonly known as: REMERON Take 1 tablet (15 mg total) by mouth at bedtime.               Discharge Care Instructions  (From admission, onward)           Start     Ordered   04/22/23 0000  No dressing needed        04/22/23 7829            Follow-up Information     Darnell Level, MD. Schedule an appointment as soon as possible for a visit in 3 week(s).   Specialty: General Surgery Why: For wound re-check Contact information: [address] [place] [address] [phone]                  Darnell Level, MD [facility] Office: [phone]   Signed: Darnell Level 04/22/2023, 8:55 AM

## 2023-04-23 LAB — SURGICAL PATHOLOGY

## 2023-04-24 NOTE — Progress Notes (Signed)
Pathology is benign as expected.  Small amount of parathyroid tissue noted - not a concern.  Will have labs checked by endocrinology.  Will see in office as scheduled.  Darnell Level, MD [facility] A [facility] practice Office: [phone]
